# Patient Record
Sex: Female | Born: 1945 | Race: White | Hispanic: No | Marital: Married | Smoking: Never smoker
Health system: Southern US, Community
[De-identification: ages and names within clinical notes are randomized; demographics above are authoritative.]

## PROBLEM LIST (undated history)

## (undated) DIAGNOSIS — M542 Cervicalgia: Secondary | ICD-10-CM

## (undated) DIAGNOSIS — Z9889 Other specified postprocedural states: Secondary | ICD-10-CM

## (undated) DIAGNOSIS — M5030 Other cervical disc degeneration, unspecified cervical region: Secondary | ICD-10-CM

## (undated) DIAGNOSIS — E785 Hyperlipidemia, unspecified: Secondary | ICD-10-CM

## (undated) DIAGNOSIS — B069 Rubella without complication: Secondary | ICD-10-CM

## (undated) DIAGNOSIS — B269 Mumps without complication: Secondary | ICD-10-CM

## (undated) DIAGNOSIS — M48061 Spinal stenosis, lumbar region without neurogenic claudication: Secondary | ICD-10-CM

## (undated) DIAGNOSIS — R002 Palpitations: Secondary | ICD-10-CM

## (undated) DIAGNOSIS — H269 Unspecified cataract: Secondary | ICD-10-CM

## (undated) DIAGNOSIS — R32 Unspecified urinary incontinence: Secondary | ICD-10-CM

## (undated) DIAGNOSIS — H9319 Tinnitus, unspecified ear: Secondary | ICD-10-CM

## (undated) DIAGNOSIS — K648 Other hemorrhoids: Secondary | ICD-10-CM

## (undated) DIAGNOSIS — Z9289 Personal history of other medical treatment: Secondary | ICD-10-CM

## (undated) DIAGNOSIS — K219 Gastro-esophageal reflux disease without esophagitis: Secondary | ICD-10-CM

## (undated) DIAGNOSIS — M1990 Unspecified osteoarthritis, unspecified site: Secondary | ICD-10-CM

## (undated) DIAGNOSIS — J302 Other seasonal allergic rhinitis: Secondary | ICD-10-CM

## (undated) DIAGNOSIS — Z8619 Personal history of other infectious and parasitic diseases: Secondary | ICD-10-CM

## (undated) DIAGNOSIS — R112 Nausea with vomiting, unspecified: Secondary | ICD-10-CM

## (undated) DIAGNOSIS — B059 Measles without complication: Secondary | ICD-10-CM

## (undated) DIAGNOSIS — N3020 Other chronic cystitis without hematuria: Secondary | ICD-10-CM

## (undated) DIAGNOSIS — K5790 Diverticulosis of intestine, part unspecified, without perforation or abscess without bleeding: Secondary | ICD-10-CM

## (undated) HISTORY — PX: COLONOSCOPY: SHX174

## (undated) HISTORY — PX: CYST EXCISION PERINEAL: SHX6278

---

## 1983-01-05 HISTORY — PX: NASAL SINUS SURGERY: SHX719

## 2000-11-02 ENCOUNTER — Ambulatory Visit (HOSPITAL_COMMUNITY): Admission: RE | Admit: 2000-11-02 | Discharge: 2000-11-02 | Payer: Self-pay | Admitting: Gastroenterology

## 2007-05-10 ENCOUNTER — Encounter: Admission: RE | Admit: 2007-05-10 | Discharge: 2007-05-10 | Payer: Self-pay | Admitting: Family Medicine

## 2008-01-18 ENCOUNTER — Other Ambulatory Visit: Admission: RE | Admit: 2008-01-18 | Discharge: 2008-01-18 | Payer: Self-pay | Admitting: Family Medicine

## 2009-02-12 ENCOUNTER — Other Ambulatory Visit: Admission: RE | Admit: 2009-02-12 | Discharge: 2009-02-12 | Payer: Self-pay | Admitting: Family Medicine

## 2010-02-16 ENCOUNTER — Other Ambulatory Visit (HOSPITAL_COMMUNITY)
Admission: RE | Admit: 2010-02-16 | Discharge: 2010-02-16 | Disposition: A | Discharge status: Home / Self Care | Payer: BC Managed Care – PPO | Source: Ambulatory Visit | Attending: Family Medicine | Admitting: Family Medicine

## 2010-02-16 ENCOUNTER — Other Ambulatory Visit: Payer: Self-pay | Admitting: Family Medicine

## 2010-02-16 DIAGNOSIS — Z01419 Encounter for gynecological examination (general) (routine) without abnormal findings: Secondary | ICD-10-CM | POA: Insufficient documentation

## 2010-05-22 NOTE — Procedures (Signed)
[place]  Patient:    HIYA, POINT Visit Number: [id number] MRN: [id number]          Service Type: Attending:  Anselmo Rod, M.D. Dictated by:   Anselmo Rod, M.D. Proc. Date: 11/02/00   CC:         Eula Listen, M.D.                           Procedure Report  DATE OF BIRTH:  May 30, 1945  REFERRING PHYSICIAN:  Eula Listen, M.D.  PROCEDURE PERFORMED:  Colonoscopy.  ENDOSCOPIST:  Anselmo Rod, M.D.  INSTRUMENT USED:  Olympus pediatric video colonoscope.  INDICATIONS FOR PROCEDURE:  Rectal bleeding in a 65 year old white female, rule out colonic polyps, masses, hemorrhoids, etc.  PREPROCEDURE PREPARATION:  Informed consent was procured from the patient. The patient was fasted for eight hours prior to the procedure and prepped with a bottle of magnesium citrate and a gallon of NuLytely the night prior to the procedure.  PREPROCEDURE PHYSICAL:  The patient had stable vital signs.  Neck supple. Chest clear to auscultation.  S1, S2 regular.  Abdomen soft with normal abdominal bowel sounds.  DESCRIPTION OF PROCEDURE:  The patient was placed in the left lateral decubitus position and sedated with 25 mg of Demerol and 2.5 mg of Versed intravenously.  She also received 25 mg of Phenergan prior to the procedure because of nausea.  Once the patient was adequately sedated and maintained on low-flow oxygen and continuous cardiac monitoring, the Olympus video colonoscope was advanced from the rectum to the cecum without difficulty. Small internal hemorrhoids were noted on retroflexion in the rectum.  There was early left-sided diverticulosis.  No other abnormalities were seen.  The patient tolerated the procedure well without complication.  IMPRESSION: 1. Small internal hemorrhoids. 2. Early left-sided diverticulosis. 3. No masses or polyps seen.  RECOMMENDATIONS: 1. A high fiber diet has been advised. 2.  Outpatient follow-up is advised on a p.r.n. basis. 3. Repeat colorectal cancer screening is recommended in the next five years    unless the patient were to develop any abnormal symptoms in the interim.Dictated by:   Anselmo Rod, M.D. Attending:  Anselmo Rod, M.D. DD:  11/02/00 TD:  11/02/00 Job: [id number]

## 2013-01-03 ENCOUNTER — Other Ambulatory Visit (HOSPITAL_COMMUNITY)
Admission: RE | Admit: 2013-01-03 | Discharge: 2013-01-03 | Disposition: A | Discharge status: Home / Self Care | Payer: Medicare Other | Source: Ambulatory Visit | Attending: Obstetrics and Gynecology | Admitting: Obstetrics and Gynecology

## 2013-01-03 ENCOUNTER — Other Ambulatory Visit: Payer: Self-pay | Admitting: Obstetrics and Gynecology

## 2013-01-03 DIAGNOSIS — Z1151 Encounter for screening for human papillomavirus (HPV): Secondary | ICD-10-CM | POA: Insufficient documentation

## 2013-01-03 DIAGNOSIS — Z124 Encounter for screening for malignant neoplasm of cervix: Secondary | ICD-10-CM | POA: Insufficient documentation

## 2013-03-28 ENCOUNTER — Encounter (HOSPITAL_COMMUNITY): Payer: Self-pay | Admitting: Pharmacist

## 2013-03-28 NOTE — Patient Instructions (Addendum)
   Your procedure is scheduled on:  Tuesday, Mar 31  Enter through the [employer] [facility] at: 9 AM Pick up the phone at the desk and dial [phone] and inform us of your arrival.  Please call this number if you have any problems the morning of surgery: [phone]  Remember: Do not eat after midnight: Monday Do Not drink clears after 630 AM Tuesday, day of surgey Take these medicines the morning of surgery with a SIP OF WATER: None  Do not wear jewelry, make-up, or FINGER nail polish No metal in your hair or on your body. Do not wear lotions, powders, perfumes.  You may wear deodorant.  Do not bring valuables to the hospital. Contacts, dentures or bridgework may not be worn into surgery.  Patients discharged on the day of surgery will not be allowed to drive home.  Home with husband [place] cell [phone]

## 2013-03-29 ENCOUNTER — Encounter (HOSPITAL_COMMUNITY)
Admission: RE | Admit: 2013-03-29 | Discharge: 2013-03-29 | Disposition: A | Discharge status: Home / Self Care | Payer: Medicare HMO | Source: Ambulatory Visit | Attending: Obstetrics and Gynecology | Admitting: Obstetrics and Gynecology

## 2013-03-29 ENCOUNTER — Encounter (HOSPITAL_COMMUNITY): Payer: Self-pay

## 2013-03-29 DIAGNOSIS — Z01810 Encounter for preprocedural cardiovascular examination: Secondary | ICD-10-CM | POA: Insufficient documentation

## 2013-03-29 HISTORY — DX: Other specified postprocedural states: R11.2

## 2013-03-29 HISTORY — DX: Nausea with vomiting, unspecified: Z98.890

## 2013-03-29 HISTORY — DX: Hyperlipidemia, unspecified: E78.5

## 2013-03-29 HISTORY — DX: Other seasonal allergic rhinitis: J30.2

## 2013-03-29 HISTORY — DX: Gastro-esophageal reflux disease without esophagitis: K21.9

## 2013-04-03 ENCOUNTER — Ambulatory Visit (HOSPITAL_COMMUNITY): Payer: Medicare HMO | Admitting: Anesthesiology

## 2013-04-03 ENCOUNTER — Encounter (HOSPITAL_COMMUNITY): Payer: Medicare HMO | Admitting: Anesthesiology

## 2013-04-03 ENCOUNTER — Ambulatory Visit (HOSPITAL_COMMUNITY)
Admission: RE | Admit: 2013-04-03 | Discharge: 2013-04-03 | Disposition: A | Discharge status: Home / Self Care | Payer: Medicare HMO | Source: Ambulatory Visit | Attending: Obstetrics and Gynecology | Admitting: Obstetrics and Gynecology

## 2013-04-03 ENCOUNTER — Encounter (HOSPITAL_COMMUNITY): Payer: Self-pay | Admitting: Anesthesiology

## 2013-04-03 ENCOUNTER — Encounter (HOSPITAL_COMMUNITY)
Admission: RE | Disposition: A | Discharge status: Home / Self Care | Payer: Self-pay | Source: Ambulatory Visit | Attending: Obstetrics and Gynecology

## 2013-04-03 DIAGNOSIS — K219 Gastro-esophageal reflux disease without esophagitis: Secondary | ICD-10-CM | POA: Insufficient documentation

## 2013-04-03 DIAGNOSIS — N950 Postmenopausal bleeding: Secondary | ICD-10-CM | POA: Insufficient documentation

## 2013-04-03 DIAGNOSIS — Z9889 Other specified postprocedural states: Secondary | ICD-10-CM

## 2013-04-03 DIAGNOSIS — E785 Hyperlipidemia, unspecified: Secondary | ICD-10-CM | POA: Insufficient documentation

## 2013-04-03 DIAGNOSIS — F3289 Other specified depressive episodes: Secondary | ICD-10-CM | POA: Insufficient documentation

## 2013-04-03 DIAGNOSIS — F329 Major depressive disorder, single episode, unspecified: Secondary | ICD-10-CM | POA: Insufficient documentation

## 2013-04-03 HISTORY — PX: HYSTEROSCOPY WITH D & C: SHX1775

## 2013-04-03 SURGERY — DILATATION AND CURETTAGE /HYSTEROSCOPY
Anesthesia: General | Site: Vagina

## 2013-04-03 MED ORDER — IBUPROFEN 600 MG PO TABS: 600.0000 mg | ORAL_TABLET | Freq: Four times a day (QID) | ORAL | Status: DC | PRN

## 2013-04-03 MED ORDER — FENTANYL CITRATE 0.05 MG/ML IJ SOLN: 25.0000 ug | INTRAMUSCULAR | Status: DC | PRN

## 2013-04-03 MED ORDER — ONDANSETRON HCL 4 MG/2ML IJ SOLN
INTRAMUSCULAR | Status: AC
  Filled 2013-04-03: qty 2

## 2013-04-03 MED ORDER — LIDOCAINE HCL (CARDIAC) 20 MG/ML IV SOLN
INTRAVENOUS | Status: AC
  Filled 2013-04-03: qty 5

## 2013-04-03 MED ORDER — MIDAZOLAM HCL 2 MG/2ML IJ SOLN
INTRAMUSCULAR | Status: DC | PRN
  Administered 2013-04-03: 1 mg via INTRAVENOUS

## 2013-04-03 MED ORDER — ASPIRIN-ACETAMINOPHEN-CAFFEINE 250-250-65 MG PO TABS: 1.0000 | ORAL_TABLET | Freq: Four times a day (QID) | ORAL | Status: DC | PRN

## 2013-04-03 MED ORDER — LACTATED RINGERS IV SOLN: INTRAVENOUS | Status: DC | PRN

## 2013-04-03 MED ORDER — ONDANSETRON HCL 4 MG/2ML IJ SOLN
INTRAMUSCULAR | Status: DC | PRN
  Administered 2013-04-03: 4 mg via INTRAVENOUS

## 2013-04-03 MED ORDER — FENTANYL CITRATE 0.05 MG/ML IJ SOLN
INTRAMUSCULAR | Status: AC
  Filled 2013-04-03: qty 2

## 2013-04-03 MED ORDER — LIDOCAINE HCL 2 % IJ SOLN
INTRAMUSCULAR | Status: AC
  Filled 2013-04-03: qty 20

## 2013-04-03 MED ORDER — DIPHENHYDRAMINE HCL 50 MG/ML IJ SOLN
INTRAMUSCULAR | Status: AC
  Filled 2013-04-03: qty 1

## 2013-04-03 MED ORDER — KETOROLAC TROMETHAMINE 30 MG/ML IJ SOLN
INTRAMUSCULAR | Status: DC | PRN
  Administered 2013-04-03: 30 mg via INTRAVENOUS

## 2013-04-03 MED ORDER — FENTANYL CITRATE 0.05 MG/ML IJ SOLN
INTRAMUSCULAR | Status: DC | PRN
  Administered 2013-04-03 (×2): 50 ug via INTRAVENOUS

## 2013-04-03 MED ORDER — CEFAZOLIN SODIUM-DEXTROSE 2-3 GM-% IV SOLR
INTRAVENOUS | Status: AC
  Filled 2013-04-03: qty 50

## 2013-04-03 MED ORDER — LIDOCAINE HCL 2 % IJ SOLN
INTRAMUSCULAR | Status: DC | PRN
  Administered 2013-04-03: 10 mL

## 2013-04-03 MED ORDER — MEPERIDINE HCL 25 MG/ML IJ SOLN
6.2500 mg | INTRAMUSCULAR | Status: DC | PRN
Start: 2013-04-03 — End: 2013-04-03

## 2013-04-03 MED ORDER — DIPHENHYDRAMINE HCL 50 MG/ML IJ SOLN
INTRAMUSCULAR | Status: DC | PRN
  Administered 2013-04-03: 12.5 mg via INTRAVENOUS

## 2013-04-03 MED ORDER — DEXAMETHASONE SODIUM PHOSPHATE 10 MG/ML IJ SOLN
INTRAMUSCULAR | Status: AC
  Filled 2013-04-03: qty 1

## 2013-04-03 MED ORDER — LIDOCAINE HCL (CARDIAC) 20 MG/ML IV SOLN
INTRAVENOUS | Status: DC | PRN
  Administered 2013-04-03: 20 mg via INTRAVENOUS
  Administered 2013-04-03: 70 mg via INTRAVENOUS

## 2013-04-03 MED ORDER — CEFAZOLIN SODIUM-DEXTROSE 2-3 GM-% IV SOLR
2.0000 g | INTRAVENOUS | Status: AC
  Administered 2013-04-03: 2 g via INTRAVENOUS

## 2013-04-03 MED ORDER — LACTATED RINGERS IV SOLN
INTRAVENOUS | Status: DC | PRN
  Administered 2013-04-03 (×3): via INTRAVENOUS

## 2013-04-03 MED ORDER — PROPOFOL 10 MG/ML IV EMUL
INTRAVENOUS | Status: AC
  Filled 2013-04-03: qty 20

## 2013-04-03 MED ORDER — DEXAMETHASONE SODIUM PHOSPHATE 10 MG/ML IJ SOLN
INTRAMUSCULAR | Status: DC | PRN
  Administered 2013-04-03: 10 mg via INTRAVENOUS

## 2013-04-03 MED ORDER — PROPOFOL 10 MG/ML IV BOLUS
INTRAVENOUS | Status: DC | PRN
  Administered 2013-04-03: 180 mg via INTRAVENOUS

## 2013-04-03 MED ORDER — METOCLOPRAMIDE HCL 5 MG/ML IJ SOLN: 10.0000 mg | Freq: Once | INTRAMUSCULAR | Status: DC | PRN

## 2013-04-03 MED ORDER — KETOROLAC TROMETHAMINE 30 MG/ML IJ SOLN
INTRAMUSCULAR | Status: AC
  Filled 2013-04-03: qty 1

## 2013-04-03 MED ORDER — LACTATED RINGERS IV SOLN
INTRAVENOUS | Status: DC
  Administered 2013-04-03: 11:00:00 via INTRAVENOUS

## 2013-04-03 MED ORDER — MIDAZOLAM HCL 2 MG/2ML IJ SOLN
INTRAMUSCULAR | Status: AC
  Filled 2013-04-03: qty 2

## 2013-04-03 SURGICAL SUPPLY — 21 items
CANISTER SUCT 3000ML (MISCELLANEOUS) ×2 IMPLANT
CATH ROBINSON RED A/P 16FR (CATHETERS) ×2 IMPLANT
CLOTH BEACON ORANGE TIMEOUT ST (SAFETY) ×2 IMPLANT
CONTAINER PREFILL 10% NBF 60ML (FORM) ×4 IMPLANT
DILATOR CANAL MILEX (MISCELLANEOUS) IMPLANT
DRAPE HYSTEROSCOPY (DRAPE) ×2 IMPLANT
DRSG TELFA 3X8 NADH (GAUZE/BANDAGES/DRESSINGS) ×2 IMPLANT
GLOVE BIO SURGEON STRL SZ7 (GLOVE) ×2 IMPLANT
GLOVE BIOGEL PI IND STRL 7.0 (GLOVE) ×1 IMPLANT
GLOVE BIOGEL PI INDICATOR 7.0 (GLOVE) ×1
GOWN STRL REUS W/TWL LRG LVL3 (GOWN DISPOSABLE) ×4 IMPLANT
NDL SPNL 22GX3.5 QUINCKE BK (NEEDLE) ×1 IMPLANT
NEEDLE SPNL 22GX3.5 QUINCKE BK (NEEDLE) ×2 IMPLANT
PACK VAGINAL MINOR WOMEN LF (CUSTOM PROCEDURE TRAY) ×2 IMPLANT
PAD DRESSING TELFA 3X8 NADH (GAUZE/BANDAGES/DRESSINGS) ×1 IMPLANT
PAD OB MATERNITY 4.3X12.25 (PERSONAL CARE ITEMS) ×2 IMPLANT
SET TUBING HYSTEROSCOPY 2 NDL (TUBING) ×1 IMPLANT
SYR CONTROL 10ML LL (SYRINGE) ×2 IMPLANT
TOWEL OR 17X24 6PK STRL BLUE (TOWEL DISPOSABLE) ×4 IMPLANT
TUBE HYSTEROSCOPY W Y-CONNECT (TUBING) ×1 IMPLANT
WATER STERILE IRR 1000ML POUR (IV SOLUTION) ×2 IMPLANT

## 2013-04-03 NOTE — H&P (Signed)
History of Present Illness  General:  68 y/o presents for preop for hysteroscopy D&C Pt initially presented with a h/o PMB x 3 years. EMB done 12/2012 showed a benign polyp. An ultrasound after EMB showed EMS of less than 2 mm. Observation was recommended at that time. Pt had another episode of heavy bleeding for a few days. Pt given Estrace in prepartion for procedure. Completed a few weeks. Stopped using it 10 days ago.  Pt denies any pelvic pain.   Current Medications  Taking   Multivitamins Capsule as directed   Calcium + D 600-200 MG-UNIT Tablet 1 tablet Once a day   Vitamin D3 2000 UNIT Capsule as directed   Potassium 99 MG Tablet as directed   Ocuvite Adult 50+ Capsule as directed   Flonase 50 MCG/ACT Suspension 2 sprays at bedtime   Benadryl 25 MG Capsule 1 capsule Twice a day   Vitamin B12 100 MCG Tablet 1 tablet Once a day   Cranberry 405 MG Capsule   Eye Drops 0.05 % Solution 1 drop into affected eye as needed every 6 hrs   Zyrtec Allergy 10 MG Tablet 1 tablet as needed Once a day   Magnesium 300 MG Capsule 1 capsule with a meal Once a day   Zinc 100 MG Tablet 1 tablet with a meal Once a day   Pravastatin Sodium 40 MG Tablet 1 tablet Once a day   Niacin 500 MG Tablet as directed Twice a day   Flaxseed Oil 1300 MG Capsule as directed   Duloxetine HCl 60 MG Capsule Delayed Release Particles 1 capsule Every evening on a full stomach with high protein content   Aleve 220 MG Capsule , Notes: 2 tablets qam , 1 at night   Not-Taking/PRN   Ibuprofen 200 MG Tablet 1 tablet as needed every 4 hrs, Notes: Last use less than 1 week ago   Tylenol 325 MG Tablet 2 tablets every 6 hrs, as needed for pain   Mucinex 600 MG Tablet Extended Release 12 Hour 1 tablet as needed every 12 hrs   Zaditor as needed/eye drops   Meclizine HCl 12.5 MG Tablet 2 tablets q 8 hrs, prn dizzines   Discontinued   Estrace 0.1 MG/GM Cream 1 gram 1 gram twice weekly   Medication List reviewed and reconciled  with the patient    Past Medical History  Allergic rhinitis  History of hypokalemia  History of internal hemorrhoid  history of elevated LDL cholesterol  Dental implant x 1   Surgical History  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    Social History  General:  Tobacco use  cigarettes: Never smoked Tobacco history last updated 03/27/2013 no Smoking.  no Alcohol.  Caffeine: yes, coffee, soda, tea.  no Recreational drug use.  Exercise: nothing structured.  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  Marital Status: married.  Children: Grinnell, Crystal.  Religion: [employer].  Seat belt use: always.    Gyn History  Sexual activity currently sexually active.  Periods : postmenopausal at age55.  LMP spotting week of Feb 10th and then turned into a regular like period that lasted for 2-3 days.  Birth control vasectomy.  Last pap smear date 01/03/13, all negative.  Last mammogram date 02/28/12.  Denies H/O Abnormal pap smear.  STD none.  Menarche 71.  GYN procedures Endometrial Biopsy, 01/03/13, benign polyp, Pelvic U/S, 01/10/13.    OB History  Number of pregnancies 2.  Pregnancy # 1 live birth, vaginal delivery, girl.  Pregnancy # 2 Full term, live birth, normal vaginal delivery.    Allergies  achromycin: disoreintation  Aspirin: tenitis   Hospitalization/Major Diagnostic Procedure  child birth x2    Review of Systems  Denies fever/chills, chest pain, SOB,  headaches, numbness/tingling. No h/o complication with anesthesia, bleeding disorders or blood clots.   Vital Signs  Wt 162, Wt change 3 lb, Ht 62, BMI 29.63, Pulse sitting 82, BP sitting 138/92.   Physical Examination  GENERAL:  Patient appears alert and oriented.  General Appearance: well-appearing, well-developed, no acute distress.  Speech: clear.  LUNGS:  Auscultation: no wheezing/rhonchi/rales. CTA bilaterally.  HEART:  Heart sounds: normal. RRR. no murmur.  ABDOMEN:  General: soft nontender, nondistended, no masses.  FEMALE GENITOURINARY:  Pelvic Not examined.  EXTREMITIES:  General: No edema or calf tenderness.     Assessments   1. Pre-op exam - V72.84 (Primary)   2. Post-menopausal bleeding - 627.1   Treatment  1. Pre-op exam  Notes: R/B/A of procedure discussed with pt and husband at length. All questions answered. Consent obtained.    Follow Up  2 Weeks post op

## 2013-04-03 NOTE — Discharge Instructions (Addendum)
Hysteroscopy Hysteroscopy is a procedure used for looking inside the womb (uterus). It may be done for various reasons, including:  To evaluate abnormal bleeding, fibroid (benign, noncancerous) tumors, polyps, scar tissue (adhesions), and possibly cancer of the uterus.  To look for lumps (tumors) and other uterine growths.  To look for causes of why a woman cannot get pregnant (infertility), causes of recurrent loss of pregnancy (miscarriages), or a lost intrauterine device (IUD).  To perform a sterilization by blocking the fallopian tubes from inside the uterus. In this procedure, a thin, flexible tube with a tiny light and camera on the end of it (hysteroscope) is used to look inside the uterus. A hysteroscopy should be done right after a menstrual period to be sure you are not pregnant. LET [facility] CARE PROVIDER KNOW ABOUT:   Any allergies you have.  All medicines you are taking, including vitamins, herbs, eye drops, creams, and over-the-counter medicines.  Previous problems you or members of your family have had with the use of anesthetics.  Any blood disorders you have.  Previous surgeries you have had.  Medical conditions you have. RISKS AND COMPLICATIONS  Generally, this is a safe procedure. However, as with any procedure, complications can occur. Possible complications include:  Putting a hole in the uterus.  Excessive bleeding.  Infection.  Damage to the cervix.  Injury to other organs.  Allergic reaction to medicines.  Too much fluid used in the uterus for the procedure. BEFORE THE PROCEDURE   Ask your health care provider about changing or stopping any regular medicines.  Do not take aspirin or blood thinners for 1 week before the procedure, or as directed by your health care provider. These can cause bleeding.  If you smoke, do not smoke for 2 weeks before the procedure.  In some cases, a medicine is placed in the cervix the day before the procedure.  This medicine makes the cervix have a larger opening (dilate). This makes it easier for the instrument to be inserted into the uterus during the procedure.  Do not eat or drink anything for at least 8 hours before the surgery.  Arrange for someone to take you home after the procedure. PROCEDURE   You may be given a medicine to relax you (sedative). You may also be given one of the following:  A medicine that numbs the area around the cervix (local anesthetic).  A medicine that makes you sleep through the procedure (general anesthetic).  The hysteroscope is inserted through the vagina into the uterus. The camera on the hysteroscope sends a picture to a TV screen. This gives the surgeon a good view inside the uterus.  During the procedure, air or a liquid is put into the uterus, which allows the surgeon to see better.  Sometimes, tissue is gently scraped from inside the uterus. These tissue samples are sent to a lab for testing. AFTER THE PROCEDURE   If you had a general anesthetic, you may be groggy for a couple hours after the procedure.  If you had a local anesthetic, you will be able to go home as soon as you are stable and feel ready.  You may have some cramping. This normally lasts for a couple days.  You may have bleeding, which varies from light spotting for a few days to menstrual-like bleeding for 3 7 days. This is normal.  If your test results are not back during the visit, make an appointment with your health care provider to find out  the results. Document Released: 03/29/2000 Document Revised: 10/11/2012 Document Reviewed: 07/20/2012 [facility] Patient Information 2014 [place], [place].    DO NOT TAKE IBUPROFEN NOR ALEVE UNTIL 5 PM TODAY.  YOU RECEIVED A SIMILAR MEDICATION IN THE OPERATING ROOM TODAY.

## 2013-04-03 NOTE — Op Note (Signed)
NAMEIVIONA, HOLE NO.:  [id number]  MEDICAL RECORD NO.:  [id number]  LOCATION:  [facility]                          FACILITY:  [facility]  PHYSICIAN:  Jola Schmidt, MD   DATE OF BIRTH:  [birth year]  DATE OF PROCEDURE:  04/03/2013 DATE OF DISCHARGE:  04/03/2013                              OPERATIVE REPORT   PREOPERATIVE DIAGNOSIS:  Postmenopausal bleeding and endometrial polyp.  POSTOPERATIVE DIAGNOSIS:  Postmenopausal bleeding and endometrial polyp.  PROCEDURE:  Hysteroscopy, D and C.  SURGEON:  Jola Schmidt, MD.  ASSISTANT:  None.  ANESTHESIA:  General (LMA) and Local with 2% lidocaine (10 mL).  IV FLUIDS IN:  A 1000 mL.  URINE OUT:  At the beginning of the case 250 mL.  ESTIMATED BLOOD LOSS:  Minimal.  SPECIMENS:  Endometrial curettings to Pathology.  DISPOSITION:  The patient to PACU hemodynamically stable.  COMPLICATIONS:  None.  FINDINGS:  Atrophic endometrium.  No endometrial polyp was seen, a small raised area appears benign.  No obvious signs of any adhesions or any signs suspicious for endometrial cancer.  Normal endometrial cavity. Otherwise, ostia visualized.  No evidence of perforation.  PROCEDURE IN DETAIL:  Ms. Losier was identified in the holding area. She was then taken into the operating room, placed in the dorsal supine position, and underwent LMA anesthesia without complication.  She was then placed in the dorsal lithotomy position and prepped and draped in a normal sterile fashion.  I and O cath was performed.  Graves speculum was inserted into the vaginal vault.  Paracervical block was performed with 2% lidocaine.  The anterior lip of the cervix was grasped with a single-tooth tenaculum.  The OS Finder was used to identify the endocervical canal.  The hysteroscope was then advanced slowly due to the uterine fundus and the findings above were noted.  The hysteroscope was then removed.  Telfa was placed in the vagina.  Sharp  curettage of all 4 quadrants was performed.  There was more tissue on the anterior quadrant.  I then took a second look with the hysteroscope to confirm that there was no evidence of perforation and everything appeared to be normal.  Hysteroscope, single-tooth tenaculum, and  Graves speculum were all removed from the vagina.  There was no bleeding noted at the time of removal of the tenaculum.  The patient tolerated the procedure well.  She was awakened in the OR, and taken to the recovery room in stable condition.  She was on SCDs throughout the entire case, and she received Ancef 2 g IV prior to the procedure.     Jola Schmidt, MD     EBV/MEDQ  D:  04/03/2013  T:  04/03/2013  Job:  [id number]

## 2013-04-03 NOTE — Anesthesia Preprocedure Evaluation (Addendum)
Anesthesia Evaluation  Patient identified by MRN, date of birth, ID band Patient awake    Reviewed: Allergy & Precautions, H&P , NPO status , Patient's Chart, lab work & pertinent test results  History of Anesthesia Complications (+) PONV and history of anesthetic complications  Airway Mallampati: III TM Distance: >3 FB Neck ROM: Full    Dental no notable dental hx. (+) Teeth Intact   Pulmonary neg pulmonary ROS,  breath sounds clear to auscultation  Pulmonary exam normal       Cardiovascular negative cardio ROS  Rhythm:Regular Rate:Normal     Neuro/Psych PSYCHIATRIC DISORDERS Depression negative neurological ROS     GI/Hepatic Neg liver ROS, GERD-  Medicated and Controlled,  Endo/Other  Hyperlipidemia  Renal/GU negative Renal ROS  negative genitourinary   Musculoskeletal negative musculoskeletal ROS (+)   Abdominal   Peds  Hematology negative hematology ROS (+)   Anesthesia Other Findings   Reproductive/Obstetrics Post menopausal bleeding                           Anesthesia Physical Anesthesia Plan  ASA: II  Anesthesia Plan: General   Post-op Pain Management:    Induction: Intravenous  Airway Management Planned: LMA  Additional Equipment:   Intra-op Plan:   Post-operative Plan: Extubation in OR  Informed Consent: I have reviewed the patients History and Physical, chart, labs and discussed the procedure including the risks, benefits and alternatives for the proposed anesthesia with the patient or authorized representative who has indicated his/her understanding and acceptance.   Dental advisory given  Plan Discussed with: Anesthesiologist, CRNA and Surgeon  Anesthesia Plan Comments:         Anesthesia Quick Evaluation

## 2013-04-03 NOTE — Transfer of Care (Signed)
Immediate Anesthesia Transfer of Care Note  Patient: Natasha [name]  Procedure(s) Performed: Procedure(s): DILATATION AND CURETTAGE /HYSTEROSCOPY (N/A)  Patient Location: PACU  Anesthesia Type:General  Level of Consciousness: awake, oriented, sedated and patient cooperative  Airway & Oxygen Therapy: Patient Spontanous Breathing and Patient connected to nasal cannula oxygen  Post-op Assessment: Report given to PACU RN and Post -op Vital signs reviewed and stable  Post vital signs: Reviewed and stable  Complications: No apparent anesthesia complications

## 2013-04-03 NOTE — Anesthesia Postprocedure Evaluation (Signed)
  Anesthesia Post-op Note  Patient: Natasha [name]  Procedure(s) Performed: Procedure(s): DILATATION AND CURETTAGE /HYSTEROSCOPY (N/A)  Patient Location: PACU  Anesthesia Type:General  Level of Consciousness: awake, alert  and oriented  Airway and Oxygen Therapy: Patient Spontanous Breathing  Post-op Pain: mild  Post-op Assessment: Post-op Vital signs reviewed, Patient's Cardiovascular Status Stable, Respiratory Function Stable, Patent Airway, No signs of Nausea or vomiting and Pain level controlled  Post-op Vital Signs: Reviewed and stable  Complications: No apparent anesthesia complications\

## 2013-04-03 NOTE — Brief Op Note (Signed)
04/03/2013  11:16 AM  PATIENT:  Natasha [name]  68 y.o. female  PRE-OPERATIVE DIAGNOSIS:  PMB, endometrial polyp  POST-OPERATIVE DIAGNOSIS:  post menopausal bleeding  PROCEDURE:  Procedure(s): DILATATION AND CURETTAGE /HYSTEROSCOPY (N/A)  SURGEON:  Surgeon(s) and Role:    * Thurnell Lose, MD - Primary  PHYSICIAN ASSISTANT: None  ASSISTANTS: none   ANESTHESIA:   general (LMA)  EBL:  Total I/O In: 1000 [I.V.:1000] Out: 250 [Urine:250]  BLOOD ADMINISTERED:none  DRAINS: none   LOCAL MEDICATIONS USED:  2 % LIDOCAINE (10 cc)  SPECIMEN:  Source of Specimen:  endometrial currettings  DISPOSITION OF SPECIMEN:  PATHOLOGY  COUNTS:  YES  TOURNIQUET:  * No tourniquets in log *  DICTATION: .Other Dictation: Dictation Number [id number]  PLAN OF CARE: Discharge to home after PACU  PATIENT DISPOSITION:  PACU - hemodynamically stable.   Delay start of Pharmacological VTE agent (>24hrs) due to surgical blood loss or risk of bleeding: not applicable

## 2013-04-03 NOTE — Interval H&P Note (Signed)
History and Physical Interval Note:  04/03/2013 10:27 AM  Natasha [name]  has presented today for surgery, with the diagnosis of PMB  The various methods of treatment have been discussed with the patient and family. After consideration of risks, benefits and other options for treatment, the patient has consented to  Procedure(s): DILATATION AND CURETTAGE /HYSTEROSCOPY (N/A) as a surgical intervention .  The patient's history has been reviewed, patient examined, no change in status, stable for surgery.  I have reviewed the patient's chart and labs.  Questions were answered to the patient's satisfaction.     Simona Huh, Eshan Trupiano

## 2013-04-04 ENCOUNTER — Encounter (HOSPITAL_COMMUNITY): Payer: Self-pay | Admitting: Obstetrics and Gynecology

## 2013-04-04 HISTORY — PX: ROTATOR CUFF REPAIR: SHX139

## 2014-03-19 ENCOUNTER — Ambulatory Visit: Payer: Self-pay | Admitting: Orthopedic Surgery

## 2014-03-19 NOTE — Progress Notes (Signed)
Preoperative surgical orders have been place into the Epic hospital system for Natasha [name] on 03/19/2014, 2:11 PM  by Mickel Crow for surgery on 04-03-2014.  Preop Total Hip - Anterior Approach orders including Experel Injecion, IV Tylenol, and IV Decadron as long as there are no contraindications to the above medications. Arlee Muslim, PA-C

## 2014-03-22 NOTE — Patient Instructions (Addendum)
Natasha [name]  03/22/2014   Your procedure is scheduled on: 04/03/14   Report to [facility] Main  Entrance and follow signs to               [place] at 12:40 PM.   Call this number if you have problems the morning of surgery [phone]   Remember:  Do not eat food  :After Midnight.              MAY HAVE CLEAR LIQUIDS UNTIL 8:40 AM                                  CLEAR LIQUID DIET   Foods Allowed                                                                     Foods Excluded  Coffee and tea, regular and decaf                             liquids that you cannot  Plain Jell-O in any flavor                                             see through such as: Fruit ices (not with fruit pulp)                                     milk, soups, orange juice  Iced Popsicles                                                All solid food Carbonated beverages, regular and diet                                    Cranberry, grape and apple juices Sports drinks like Gatorade Lightly seasoned clear broth or consume(fat free) Sugar, honey syrup  _____________________________________________________________________   Take these medicines the morning of surgery with A SIP OF WATER: zyrtec, pepcid if needed, eyedrops if needed                               You may not have any metal on your body including hair pins and              piercings  Do not wear jewelry, make-up, lotions, powders or perfumes.             Do not wear nail polish.  Do not shave  48 hours prior to surgery.              Men may shave face and  neck.  Do not bring valuables to the hospital. [place].  Contacts, dentures or bridgework may not be worn into surgery.  Leave suitcase in the car. After surgery it may be brought to your room.               Please read over the following fact sheets you were given: MRSA  information _____________________________________________________________________                                                     [place]  Before surgery, you can play an important role.  Because skin is not sterile, your skin needs to be as free of germs as possible.  You can reduce the number of germs on your skin by washing with CHG (chlorahexidine gluconate) soap before surgery.  CHG is an antiseptic cleaner which kills germs and bonds with the skin to continue killing germs even after washing. Please DO NOT use if you have an allergy to CHG or antibacterial soaps.  If your skin becomes reddened/irritated stop using the CHG and inform your nurse when you arrive at Short Stay. Do not shave (including legs and underarms) for at least 48 hours prior to the first CHG shower.  You may shave your face. Please follow these instructions carefully:   1.  Shower with CHG Soap the night before surgery and the  morning of Surgery.   2.  If you choose to wash your hair, wash your hair first as usual with your  normal  Shampoo.   3.  After you shampoo, rinse your hair and body thoroughly to remove the  shampoo.                                         4.  Use CHG as you would any other liquid soap.  You can apply chg directly  to the skin and wash . Gently wash with scrungie or clean wascloth    5.  Apply the CHG Soap to your body ONLY FROM THE NECK DOWN.   Do not use on open                           Wound or open sores. Avoid contact with eyes, ears mouth and genitals (private parts).                        Genitals (private parts) with your normal soap.              6.  Wash thoroughly, paying special attention to the area where your surgery  will be performed.   7.  Thoroughly rinse your body with warm water from the neck down.   8.  DO NOT shower/wash with your normal soap after using and rinsing off  the CHG Soap .                9.  Pat yourself dry with a  clean towel.             10.  Wear clean pajamas.             11.  Place clean sheets on your bed the night of your first shower and do not  sleep with pets.  Day of Surgery : Do not apply any lotions/deodorants the morning of surgery.  Please wear clean clothes to the hospital/surgery center.  FAILURE TO FOLLOW THESE INSTRUCTIONS MAY RESULT IN THE CANCELLATION OF YOUR SURGERY    PATIENT SIGNATURE_________________________________  ______________________________________________________________________     Natasha [name]  An incentive spirometer is a tool that can help keep your lungs clear and active. This tool measures how well you are filling your lungs with each breath. Taking long deep breaths may help reverse or decrease the chance of developing breathing (pulmonary) problems (especially infection) following:  A long period of time when you are unable to move or be active. BEFORE THE PROCEDURE   If the spirometer includes an indicator to show your best effort, your nurse or respiratory therapist will set it to a desired goal.  If possible, sit up straight or lean slightly forward. Try not to slouch.  Hold the incentive spirometer in an upright position. INSTRUCTIONS FOR USE   Sit on the edge of your bed if possible, or sit up as far as you can in bed or on a chair.  Hold the incentive spirometer in an upright position.  Breathe out normally.  Place the mouthpiece in your mouth and seal your lips tightly around it.  Breathe in slowly and as deeply as possible, raising the piston or the ball toward the top of the column.  Hold your breath for 3-5 seconds or for as long as possible. Allow the piston or ball to fall to the bottom of the column.  Remove the mouthpiece from your mouth and breathe out normally.  Rest for a few seconds and repeat Steps 1 through 7 at least 10 times every 1-2 hours when you are awake. Take your time and take a few normal breaths  between deep breaths.  The spirometer may include an indicator to show your best effort. Use the indicator as a goal to work toward during each repetition.  After each set of 10 deep breaths, practice coughing to be sure your lungs are clear. If you have an incision (the cut made at the time of surgery), support your incision when coughing by placing a pillow or rolled up towels firmly against it. Once you are able to get out of bed, walk around indoors and cough well. You may stop using the incentive spirometer when instructed by your caregiver.  RISKS AND COMPLICATIONS  Take your time so you do not get dizzy or light-headed.  If you are in pain, you may need to take or ask for pain medication before doing incentive spirometry. It is harder to take a deep breath if you are having pain. AFTER USE  Rest and breathe slowly and easily.  It can be helpful to keep track of a log of your progress. Your caregiver can provide you with a simple table to help with this. If you are using the spirometer at home, follow these instructions: [place] IF:   You are having difficultly using the spirometer.  You have trouble using the spirometer as often as instructed.  Your pain medication is not giving enough relief while using the spirometer.  You develop fever of 100.5 F (38.1 C) or higher. SEEK IMMEDIATE MEDICAL CARE IF:   You cough up  bloody sputum that had not been present before.  You develop fever of 102 F (38.9 C) or greater.  You develop worsening pain at or near the incision site. MAKE SURE YOU:   Understand these instructions.  Will watch your condition.  Will get help right away if you are not doing well or get worse. Document Released: 05/03/2006 Document Revised: 03/15/2011 Document Reviewed: 07/04/2006 ExitCare Patient Information 2014 ExitCare, [place].   ________________________________________________________________________  WHAT IS A BLOOD TRANSFUSION?  Blood Transfusion Information  A transfusion is the replacement of blood or some of its parts. Blood is made up of multiple cells which provide different functions.  Red blood cells carry oxygen and are used for blood loss replacement.  White blood cells fight against infection.  Platelets control bleeding.  Plasma helps clot blood.  Other blood products are available for specialized needs, such as hemophilia or other clotting disorders. BEFORE THE TRANSFUSION  Who gives blood for transfusions?   Healthy volunteers who are fully evaluated to make sure their blood is safe. This is blood bank blood. Transfusion therapy is the safest it has ever been in the practice of medicine. Before blood is taken from a donor, a complete history is taken to make sure that person has no history of diseases nor engages in risky social behavior (examples are intravenous drug use or sexual activity with multiple partners). The donor's travel history is screened to minimize risk of transmitting infections, such as malaria. The donated blood is tested for signs of infectious diseases, such as HIV and hepatitis. The blood is then tested to be sure it is compatible with you in order to minimize the chance of a transfusion reaction. If you or a relative donates blood, this is often done in anticipation of surgery and is not appropriate for emergency situations. It takes many days to process the donated blood. RISKS AND COMPLICATIONS Although transfusion therapy is very safe and saves many lives, the main dangers of transfusion include:   Getting an infectious disease.  Developing a transfusion reaction. This is an allergic reaction to something in the blood you were given. Every precaution is taken to prevent this. The decision to have a blood transfusion has been considered carefully by your caregiver before blood is given. Blood is not given unless the benefits outweigh the risks. AFTER THE TRANSFUSION  Right  after receiving a blood transfusion, you will usually feel much better and more energetic. This is especially true if your red blood cells have gotten low (anemic). The transfusion raises the level of the red blood cells which carry oxygen, and this usually causes an energy increase.  The nurse administering the transfusion will monitor you carefully for complications. HOME CARE INSTRUCTIONS  No special instructions are needed after a transfusion. You may find your energy is better. Speak with your caregiver about any limitations on activity for underlying diseases you may have. SEEK MEDICAL CARE IF:   Your condition is not improving after your transfusion.  You develop redness or irritation at the intravenous (IV) site. SEEK IMMEDIATE MEDICAL CARE IF:  Any of the following symptoms occur over the next 12 hours:  Shaking chills.  You have a temperature by mouth above 102 F (38.9 C), not controlled by medicine.  Chest, back, or muscle pain.  People around you feel you are not acting correctly or are confused.  Shortness of breath or difficulty breathing.  Dizziness and fainting.  You get a rash or develop hives.  You have  a decrease in urine output.  Your urine turns a dark color or changes to pink, red, or brown. Any of the following symptoms occur over the next 10 days:  You have a temperature by mouth above 102 F (38.9 C), not controlled by medicine.  Shortness of breath.  Weakness after normal activity.  The white part of the eye turns yellow (jaundice).  You have a decrease in the amount of urine or are urinating less often.  Your urine turns a dark color or changes to pink, red, or brown. Document Released: 12/19/1999 Document Revised: 03/15/2011 Document Reviewed: 08/07/2007 [employer] Patient Information 2014 [place], [place].  _______________________________________________________________________

## 2014-03-25 ENCOUNTER — Encounter (HOSPITAL_COMMUNITY): Payer: Self-pay

## 2014-03-25 ENCOUNTER — Encounter (HOSPITAL_COMMUNITY)
Admission: RE | Admit: 2014-03-25 | Discharge: 2014-03-25 | Disposition: A | Discharge status: Home / Self Care | Payer: Worker's Compensation | Source: Ambulatory Visit | Attending: Orthopedic Surgery | Admitting: Orthopedic Surgery

## 2014-03-25 DIAGNOSIS — Z01812 Encounter for preprocedural laboratory examination: Secondary | ICD-10-CM | POA: Insufficient documentation

## 2014-03-25 HISTORY — DX: Unspecified urinary incontinence: R32

## 2014-03-25 HISTORY — DX: Other hemorrhoids: K64.8

## 2014-03-25 HISTORY — DX: Mumps without complication: B26.9

## 2014-03-25 HISTORY — DX: Personal history of other medical treatment: Z92.89

## 2014-03-25 HISTORY — DX: Tinnitus, unspecified ear: H93.19

## 2014-03-25 HISTORY — DX: Spinal stenosis, lumbar region without neurogenic claudication: M48.061

## 2014-03-25 HISTORY — DX: Unspecified osteoarthritis, unspecified site: M19.90

## 2014-03-25 HISTORY — DX: Personal history of other infectious and parasitic diseases: Z86.19

## 2014-03-25 HISTORY — DX: Other cervical disc degeneration, unspecified cervical region: M50.30

## 2014-03-25 HISTORY — DX: Cervicalgia: M54.2

## 2014-03-25 HISTORY — DX: Unspecified cataract: H26.9

## 2014-03-25 HISTORY — DX: Measles without complication: B05.9

## 2014-03-25 HISTORY — DX: Other chronic cystitis without hematuria: N30.20

## 2014-03-25 HISTORY — DX: Diverticulosis of intestine, part unspecified, without perforation or abscess without bleeding: K57.90

## 2014-03-25 HISTORY — DX: Rubella without complication: B06.9

## 2014-03-25 LAB — COMPREHENSIVE METABOLIC PANEL
ALBUMIN: 4.1 g/dL (ref 3.5–5.2)
ALT: 20 U/L (ref 0–35)
AST: 24 U/L (ref 0–37)
Alkaline Phosphatase: 106 U/L (ref 39–117)
Anion gap: 9 (ref 5–15)
BILIRUBIN TOTAL: 0.5 mg/dL (ref 0.3–1.2)
BUN: 15 mg/dL (ref 6–23)
CO2: 29 mmol/L (ref 19–32)
Calcium: 9.9 mg/dL (ref 8.4–10.5)
Chloride: 106 mmol/L (ref 96–112)
Creatinine, Ser: 0.95 mg/dL (ref 0.50–1.10)
GFR calc Af Amer: 70 mL/min — ABNORMAL LOW (ref >90)
GFR, EST NON AFRICAN AMERICAN: 60 mL/min — AB (ref >90)
GLUCOSE: 100 mg/dL — AB (ref 70–99)
POTASSIUM: 4.7 mmol/L (ref 3.5–5.1)
SODIUM: 144 mmol/L (ref 135–145)
Total Protein: 7 g/dL (ref 6.0–8.3)

## 2014-03-25 LAB — URINALYSIS, ROUTINE W REFLEX MICROSCOPIC
BILIRUBIN URINE: NEGATIVE
GLUCOSE, UA: NEGATIVE mg/dL
Hgb urine dipstick: NEGATIVE
KETONES UR: NEGATIVE mg/dL
LEUKOCYTES UA: NEGATIVE
Nitrite: NEGATIVE
PROTEIN: NEGATIVE mg/dL
Specific Gravity, Urine: 1.005 (ref 1.005–1.030)
Urobilinogen, UA: 0.2 mg/dL (ref 0.0–1.0)
pH: 7.5 (ref 5.0–8.0)

## 2014-03-25 LAB — PROTIME-INR
INR: 0.93 (ref 0.00–1.49)
Prothrombin Time: 12.6 seconds (ref 11.6–15.2)

## 2014-03-25 LAB — CBC
HCT: 39.6 % (ref 36.0–46.0)
Hemoglobin: 13.2 g/dL (ref 12.0–15.0)
MCH: 31.7 pg (ref 26.0–34.0)
MCHC: 33.3 g/dL (ref 30.0–36.0)
MCV: 95.2 fL (ref 78.0–100.0)
Platelets: 296 10*3/uL (ref 150–400)
RBC: 4.16 MIL/uL (ref 3.87–5.11)
RDW: 14.6 % (ref 11.5–15.5)
WBC: 7 10*3/uL (ref 4.0–10.5)

## 2014-03-25 LAB — SURGICAL PCR SCREEN
MRSA, PCR: NEGATIVE
STAPHYLOCOCCUS AUREUS: NEGATIVE

## 2014-03-25 LAB — APTT: APTT: 31 s (ref 24–37)

## 2014-03-31 ENCOUNTER — Ambulatory Visit: Payer: Self-pay | Admitting: Orthopedic Surgery

## 2014-03-31 NOTE — H&P (Signed)
Natasha [name] Old DOB: [birth year] Married / Language: English / Race: White Female Date of Admission:  04/03/2014 CC:  Left Hip Pain History of Present Illness The patient is a 69 year old female who comes in for a preoperative History and Physical. The patient is scheduled for a left total hip arthroplasty (anterior approach) to be performed by Dr. Dione Plover. Aluisio, MD at [facility] on 04-03-2014. The patient is a 69 year old female who presents with a hip problem. The patient was seen in referral from Dr.Norris and for a surgical consult.The patient reports left hip (and left leg) problems including pain (with all ROM in left hip, pain in left leg pain between shin and calf, and left groin pain), weakness and giving way (left). The symptoms began following a specific injury. The injury occured DOI: 11/19/2010, past work comp case due to a fall (DOI: 11/19/2010) while the patient was at work.The symptoms are described as moderate in severity.The patient feels as if their symptoms are feels as if they are improving (patient states that her left leg feels stronger but her left hip pain is still unchanged). Previous workup for this problem has included hip x-rays and hip MRI. Previous treatment for this problem has included physical therapy (currently in PT, in the past went to [facility], also home exercise program). Ms. Hughart initially had problems related to that fall, which was approximately three years ago. She said she did not have any hip issues prior to that. She fell and landed directly on a flexed knee. She "jammed her hip". She developed significant bruising along the lateral aspect of the hip and brings pictures of that with her today. She had been released by [employer] sometime last year. She saw Dr. Veverly Fells earlier this year with increasing pain in her hip. He had ordered an MRI and showed significant degenerative change. She was subsequently referred to me  for evaluation. She has pain in her hip essentially all the time. It is worse with activity. Generally, it is somewhat better at rest. She is not having any lower extremity weakness or paresthesia. The pain does radiate down her thigh with weight bearing. She is not having any problems with her right hip. She has been treated conservatively in the past but despite consservative measures, she still has ongoing pain. It is felt that she would benefit from a total hip replacement.  They have been treated conservatively in the past for the above stated problem and despite conservative measures, they continue to have progressive pain and severe functional limitations and dysfunction. They have failed non-operative management including home exercise, medications. It is felt that they would benefit from undergoing total joint replacement. Risks and benefits of the procedure have been discussed with the patient and they elect to proceed with surgery. There are no active contraindications to surgery such as ongoing infection or rapidly progressive neurological disease.  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 Depression Maternal Grandfather. Cerebrovascular Accident Paternal Grandfather. Heart Disease Paternal Grandfather. Cancer Maternal Grandmother, Mother.  Social History Never consumed alcohol 01/23/2013: Never consumed alcohol Tobacco use Never smoker. 01/23/2013 Marital status married Not under pain contract No history of drug/alcohol rehab Living situation live with spouse Exercise Exercises weekly; does running / walking and other Children 2 Number of flights of stairs before winded 2-3 Current work status working part time  Medication History Pravastatin Sodium (40MG  Tablet, Oral)  Active. Multiple Vitamins/Womens (1 (one) Oral) Active. Calcium-Vitamin D (600MG   Tablet Chewable, Oral) Active. Niacin (50MG  Tablet, Oral) Active. Magnesium Oxide (400MG  Tablet, Oral) Active. Vitamin D3 (2000UNIT Tablet, Oral) Active. Potassium (99MG  Tablet, Oral) Active. Flonase (50MCG/ACT Suspension, Nasal) Active. Benadryl Allergy (25MG  Tablet, Oral) Active. Cranberry Fruit (405MG  Capsule, Oral) Active. Eye Drops (0.05% Solution, Ophthalmic) Active. ZyrTEC (10MG  Tablet, Oral) Active. Tylenol (325MG  Tablet, Oral) Active. Omega 3-6-9 Complex (Oral) Active. Biotin (5000MCG Tablet, Oral) Active. Lutein-Zeaxanthin (25-5MG  Capsule, Oral) Active. Zinc Gluconate (50MG  Tablet, Oral) Active. Super B Complex (Oral) Active. Glucosamine MSM Complex (Oral) Active. Famotidine (20MG  Tablet, Oral) Active. DimenhyDRINATE (50MG  Tablet, Oral) Active.  Past Surgical History Straighten Nasal Septum Date: 13. Sinus Surgery Date: 2010. Rotator Cuff Repair - Right Date: 04/2013. D & C Date: 03/2013.  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 Not Present- Chills, Fatigue, Fever, Memory Loss, Weight Gain and Weight Loss. Skin Not Present- Eczema, Hives, Itching, Lesions and Rash. HEENT Not Present- Dentures, Double Vision, Headache, Hearing Loss, Tinnitus and Visual Loss. Respiratory Not Present- Allergies, Chronic Cough, Coughing up blood, Shortness of breath at rest and Shortness of breath with exertion. Cardiovascular Present- Swelling. Not Present- Chest Pain, Difficulty Breathing Lying Down, Murmur, Palpitations and Racing/skipping heartbeats. Gastrointestinal Not Present- Abdominal Pain, Bloody Stool,  Constipation, Diarrhea, Difficulty Swallowing, Heartburn, Jaundice, Loss of appetitie, Nausea and Vomiting. Female Genitourinary Not Present- Blood in Urine, Discharge, Flank Pain, Incontinence, Painful Urination, Urgency, Urinary frequency, Urinary Retention, Urinating at Night and Weak urinary stream. Musculoskeletal Not Present- Back Pain, Joint Pain, Joint Swelling, Morning Stiffness, Muscle Pain, Muscle Weakness and Spasms. Neurological Not Present- Blackout spells, Difficulty with balance, Dizziness, Paralysis, Tremor and Weakness. Psychiatric Not Present- Insomnia.   Vitals Weight: 160 lb Height: 62in Weight was reported by patient. Height was reported by patient. Body Surface Area: 1.74 m Body Mass Index: 29.26 kg/m  BP: 144/92 (Sitting, Left Arm, Standard)   Physical Exam The physical exam findings are as follows: Note:Patietn is a 69 year old female wtih continued hip pain. Patient is accompanied today by her husband [place].  General Mental Status -Alert, cooperative and good historian. General Appearance-pleasant, Not in acute distress. Orientation-Oriented X3. Build & Nutrition-Well nourished and Well developed.  Head and Neck Head-normocephalic, atraumatic . Neck Global Assessment - supple, no bruit auscultated on the right, no bruit auscultated on the left.  Eye Pupil - Bilateral-Regular and Round. Motion - Bilateral-EOMI.  Chest and Lung Exam Auscultation Breath sounds - clear at anterior chest wall and clear at posterior chest wall. Adventitious sounds - No Adventitious sounds.  Cardiovascular Auscultation Rhythm - Regular rate and rhythm. Heart Sounds - S1 WNL and S2 WNL. Murmurs & Other Heart Sounds - Auscultation of the heart reveals - No Murmurs.  Abdomen Palpation/Percussion Tenderness - Abdomen is non-tender to palpation. Rigidity (guarding) - Abdomen is soft. Auscultation Auscultation of the abdomen reveals - Bowel sounds  normal.  Female Genitourinary Note: Not done, not pertinent to present illness   Musculoskeletal Note: The patient is a well developed, well nourished lady in no acute distress. Her hip range of motion shows full extension with further flexion to 110 degrees with some discomfort in the anterior groin. External rotation to 30 degrees with some discomfort in the anterior groin. Internal  rotation to 10 degrees with discomfort in the anterior groin. Her DTR's are 2+ and symmetrical in the Achilles and patella. Motor strength is 5/5 throughout the lower extremities. Equivocal straight leg raise test, positive on the left at 70 degrees. Negative on the right. Figure of four tests causes pain in the hip and left side of the low back. She can toe and heel walk with balance assistance. Extension of the spine causes pain in the left side of the low back and forward flexion causes pain in the anterior groin and pain down the left leg into the lateral foot.  RADIOGRAPHS: X-rays of her hip are not repeated. We know she has end stage osteoarthritis and she is scheduled for a total hip arthroplasty for this.  AP of the pelvis shows what appears to be significant degenerative disk disease changes in the lumbar spine with symptoms referable to radicular lumbar pain.  AP and lateral LS spine films are obtained today. These reveal marked degenerative disk disease in the lower lumbar spine, especially at L2-3 and moderately severe at L3-4. Severe changes at L4-5 and L5-S1 with foraminal stenosis at both of those levels. Anterior osteophytes.   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 Maury Dus - Patient has been seen preoperatively and felt to be stable for surgery.  IV TXA  Anesthesia Issues: Postop nausea in the past  Signed electronically by Joelene Millin, III  PA-C

## 2014-04-01 NOTE — Progress Notes (Signed)
Pt notified of time change. Instructed to arrive at Short Stay at 7:45 am / NPO after midnight

## 2014-04-03 ENCOUNTER — Inpatient Hospital Stay (HOSPITAL_COMMUNITY): Payer: Worker's Compensation | Admitting: Anesthesiology

## 2014-04-03 ENCOUNTER — Encounter (HOSPITAL_COMMUNITY)
Admission: RE | Disposition: A | Discharge status: Home Health | Payer: Self-pay | Source: Ambulatory Visit | Attending: Orthopedic Surgery

## 2014-04-03 ENCOUNTER — Encounter (HOSPITAL_COMMUNITY): Payer: Self-pay | Admitting: *Deleted

## 2014-04-03 ENCOUNTER — Inpatient Hospital Stay (HOSPITAL_COMMUNITY): Payer: Worker's Compensation

## 2014-04-03 ENCOUNTER — Inpatient Hospital Stay (HOSPITAL_COMMUNITY)
Admission: RE | Admit: 2014-04-03 | Discharge: 2014-04-05 | DRG: 470 | Disposition: A | Discharge status: Home Health | Payer: Worker's Compensation | Source: Ambulatory Visit | Attending: Orthopedic Surgery | Admitting: Orthopedic Surgery

## 2014-04-03 DIAGNOSIS — E785 Hyperlipidemia, unspecified: Secondary | ICD-10-CM | POA: Diagnosis present

## 2014-04-03 DIAGNOSIS — E780 Pure hypercholesterolemia: Secondary | ICD-10-CM | POA: Diagnosis present

## 2014-04-03 DIAGNOSIS — M25552 Pain in left hip: Secondary | ICD-10-CM | POA: Diagnosis present

## 2014-04-03 DIAGNOSIS — M5136 Other intervertebral disc degeneration, lumbar region: Secondary | ICD-10-CM | POA: Diagnosis present

## 2014-04-03 DIAGNOSIS — Z01812 Encounter for preprocedural laboratory examination: Secondary | ICD-10-CM | POA: Diagnosis not present

## 2014-04-03 DIAGNOSIS — Z8619 Personal history of other infectious and parasitic diseases: Secondary | ICD-10-CM

## 2014-04-03 DIAGNOSIS — Z79899 Other long term (current) drug therapy: Secondary | ICD-10-CM | POA: Diagnosis not present

## 2014-04-03 DIAGNOSIS — K219 Gastro-esophageal reflux disease without esophagitis: Secondary | ICD-10-CM | POA: Diagnosis present

## 2014-04-03 DIAGNOSIS — M169 Osteoarthritis of hip, unspecified: Secondary | ICD-10-CM | POA: Diagnosis present

## 2014-04-03 DIAGNOSIS — M1652 Unilateral post-traumatic osteoarthritis, left hip: Secondary | ICD-10-CM | POA: Diagnosis present

## 2014-04-03 DIAGNOSIS — Z96649 Presence of unspecified artificial hip joint: Secondary | ICD-10-CM

## 2014-04-03 DIAGNOSIS — Z8249 Family history of ischemic heart disease and other diseases of the circulatory system: Secondary | ICD-10-CM

## 2014-04-03 DIAGNOSIS — I951 Orthostatic hypotension: Secondary | ICD-10-CM | POA: Diagnosis not present

## 2014-04-03 HISTORY — PX: TOTAL HIP ARTHROPLASTY: SHX124

## 2014-04-03 LAB — TYPE AND SCREEN
ABO/RH(D): B POS
Antibody Screen: NEGATIVE

## 2014-04-03 LAB — ABO/RH: ABO/RH(D): B POS

## 2014-04-03 SURGERY — ARTHROPLASTY, HIP, TOTAL, ANTERIOR APPROACH
Anesthesia: Spinal | Site: Hip | Laterality: Left

## 2014-04-03 MED ORDER — PHENOL 1.4 % MT LIQD: 1.0000 | OROMUCOSAL | Status: DC | PRN

## 2014-04-03 MED ORDER — DEXAMETHASONE SODIUM PHOSPHATE 10 MG/ML IJ SOLN
INTRAMUSCULAR | Status: DC | PRN
  Administered 2014-04-03: 10 mg via INTRAVENOUS

## 2014-04-03 MED ORDER — TRANEXAMIC ACID 100 MG/ML IV SOLN
1000.0000 mg | INTRAVENOUS | Status: DC
  Filled 2014-04-03: qty 10

## 2014-04-03 MED ORDER — MIDAZOLAM HCL 2 MG/2ML IJ SOLN
INTRAMUSCULAR | Status: AC
  Filled 2014-04-03: qty 2

## 2014-04-03 MED ORDER — TRAMADOL HCL 50 MG PO TABS: 50.0000 mg | ORAL_TABLET | Freq: Four times a day (QID) | ORAL | Status: DC | PRN

## 2014-04-03 MED ORDER — POLYETHYLENE GLYCOL 3350 17 G PO PACK
17.0000 g | PACK | Freq: Every day | ORAL | Status: DC | PRN
  Administered 2014-04-03 – 2014-04-04 (×2): 17 g via ORAL
  Filled 2014-04-03 (×2): qty 1

## 2014-04-03 MED ORDER — ONDANSETRON HCL 4 MG/2ML IJ SOLN
INTRAMUSCULAR | Status: AC
  Filled 2014-04-03: qty 2

## 2014-04-03 MED ORDER — OXYCODONE HCL 5 MG/5ML PO SOLN
5.0000 mg | Freq: Once | ORAL | Status: DC | PRN
  Filled 2014-04-03: qty 5

## 2014-04-03 MED ORDER — BUPIVACAINE HCL (PF) 0.5 % IJ SOLN
INTRAMUSCULAR | Status: AC
  Filled 2014-04-03: qty 30

## 2014-04-03 MED ORDER — ONDANSETRON HCL 4 MG/2ML IJ SOLN
INTRAMUSCULAR | Status: DC | PRN
  Administered 2014-04-03: 4 mg via INTRAVENOUS

## 2014-04-03 MED ORDER — CEFAZOLIN SODIUM-DEXTROSE 2-3 GM-% IV SOLR
2.0000 g | INTRAVENOUS | Status: AC
  Administered 2014-04-03: 2 g via INTRAVENOUS

## 2014-04-03 MED ORDER — HYDROMORPHONE HCL 1 MG/ML IJ SOLN
INTRAMUSCULAR | Status: AC
  Filled 2014-04-03: qty 1

## 2014-04-03 MED ORDER — METHOCARBAMOL 500 MG PO TABS
500.0000 mg | ORAL_TABLET | Freq: Four times a day (QID) | ORAL | Status: DC | PRN
  Administered 2014-04-04 – 2014-04-05 (×4): 500 mg via ORAL
  Filled 2014-04-03 (×4): qty 1

## 2014-04-03 MED ORDER — METHOCARBAMOL 1000 MG/10ML IJ SOLN
500.0000 mg | Freq: Four times a day (QID) | INTRAVENOUS | Status: DC | PRN
  Administered 2014-04-03 – 2014-04-04 (×3): 500 mg via INTRAVENOUS
  Filled 2014-04-03 (×6): qty 5

## 2014-04-03 MED ORDER — ACETAMINOPHEN 650 MG RE SUPP: 650.0000 mg | Freq: Four times a day (QID) | RECTAL | Status: DC | PRN

## 2014-04-03 MED ORDER — CEFAZOLIN SODIUM-DEXTROSE 2-3 GM-% IV SOLR
2.0000 g | Freq: Four times a day (QID) | INTRAVENOUS | Status: AC
  Administered 2014-04-03 (×2): 2 g via INTRAVENOUS
  Filled 2014-04-03 (×2): qty 50

## 2014-04-03 MED ORDER — PROPOFOL 10 MG/ML IV BOLUS
INTRAVENOUS | Status: AC
  Filled 2014-04-03: qty 20

## 2014-04-03 MED ORDER — DEXAMETHASONE SODIUM PHOSPHATE 10 MG/ML IJ SOLN
10.0000 mg | Freq: Once | INTRAMUSCULAR | Status: AC
  Administered 2014-04-04: 10 mg via INTRAVENOUS
  Filled 2014-04-03: qty 1

## 2014-04-03 MED ORDER — MENTHOL 3 MG MT LOZG: 1.0000 | LOZENGE | OROMUCOSAL | Status: DC | PRN

## 2014-04-03 MED ORDER — FLEET ENEMA 7-19 GM/118ML RE ENEM: 1.0000 | ENEMA | Freq: Once | RECTAL | Status: AC | PRN

## 2014-04-03 MED ORDER — LACTATED RINGERS IV SOLN
INTRAVENOUS | Status: DC
  Administered 2014-04-03: 11:00:00 via INTRAVENOUS
  Administered 2014-04-03: 1000 mL via INTRAVENOUS

## 2014-04-03 MED ORDER — OXYCODONE HCL 5 MG PO TABS: 5.0000 mg | ORAL_TABLET | Freq: Once | ORAL | Status: DC | PRN

## 2014-04-03 MED ORDER — FAMOTIDINE 20 MG PO TABS
10.0000 mg | ORAL_TABLET | Freq: Two times a day (BID) | ORAL | Status: DC | PRN
  Administered 2014-04-04 – 2014-04-05 (×3): 10 mg via ORAL
  Filled 2014-04-03 (×5): qty 0.5

## 2014-04-03 MED ORDER — METOCLOPRAMIDE HCL 10 MG PO TABS: 5.0000 mg | ORAL_TABLET | Freq: Three times a day (TID) | ORAL | Status: DC | PRN

## 2014-04-03 MED ORDER — DEXAMETHASONE SODIUM PHOSPHATE 10 MG/ML IJ SOLN: 10.0000 mg | Freq: Once | INTRAMUSCULAR | Status: DC

## 2014-04-03 MED ORDER — BUPIVACAINE HCL (PF) 0.25 % IJ SOLN
INTRAMUSCULAR | Status: AC
  Filled 2014-04-03: qty 30

## 2014-04-03 MED ORDER — METOCLOPRAMIDE HCL 5 MG/ML IJ SOLN
5.0000 mg | Freq: Three times a day (TID) | INTRAMUSCULAR | Status: DC | PRN
Start: 2014-04-03 — End: 2014-04-05
  Administered 2014-04-04: 10 mg via INTRAVENOUS
  Filled 2014-04-03: qty 2

## 2014-04-03 MED ORDER — OXYCODONE HCL 5 MG PO TABS
5.0000 mg | ORAL_TABLET | ORAL | Status: DC | PRN
  Administered 2014-04-03 – 2014-04-04 (×4): 10 mg via ORAL
  Filled 2014-04-03 (×2): qty 2
  Filled 2014-04-03: qty 1
  Filled 2014-04-03: qty 2
  Filled 2014-04-03: qty 1

## 2014-04-03 MED ORDER — DEXAMETHASONE SODIUM PHOSPHATE 10 MG/ML IJ SOLN
INTRAMUSCULAR | Status: AC
  Filled 2014-04-03: qty 1

## 2014-04-03 MED ORDER — SODIUM CHLORIDE 0.9 % IV SOLN: INTRAVENOUS | Status: DC

## 2014-04-03 MED ORDER — ONDANSETRON HCL 4 MG/2ML IJ SOLN
4.0000 mg | Freq: Four times a day (QID) | INTRAMUSCULAR | Status: DC | PRN
  Administered 2014-04-04: 4 mg via INTRAVENOUS
  Filled 2014-04-03: qty 2

## 2014-04-03 MED ORDER — ACETAMINOPHEN 10 MG/ML IV SOLN
1000.0000 mg | Freq: Once | INTRAVENOUS | Status: AC
  Administered 2014-04-03: 1000 mg via INTRAVENOUS
  Filled 2014-04-03: qty 100

## 2014-04-03 MED ORDER — DOCUSATE SODIUM 100 MG PO CAPS
100.0000 mg | ORAL_CAPSULE | Freq: Two times a day (BID) | ORAL | Status: DC
  Administered 2014-04-03 – 2014-04-05 (×4): 100 mg via ORAL

## 2014-04-03 MED ORDER — BISACODYL 10 MG RE SUPP: 10.0000 mg | Freq: Every day | RECTAL | Status: DC | PRN

## 2014-04-03 MED ORDER — FENTANYL CITRATE 0.05 MG/ML IJ SOLN
INTRAMUSCULAR | Status: AC
  Filled 2014-04-03: qty 2

## 2014-04-03 MED ORDER — DIMENHYDRINATE 50 MG PO TABS
50.0000 mg | ORAL_TABLET | Freq: Three times a day (TID) | ORAL | Status: DC | PRN
  Administered 2014-04-04: 50 mg via ORAL
  Filled 2014-04-03 (×3): qty 1

## 2014-04-03 MED ORDER — HYDROMORPHONE HCL 1 MG/ML IJ SOLN
0.2500 mg | INTRAMUSCULAR | Status: DC | PRN
  Administered 2014-04-03 (×2): 0.5 mg via INTRAVENOUS

## 2014-04-03 MED ORDER — ACETAMINOPHEN 325 MG PO TABS
650.0000 mg | ORAL_TABLET | Freq: Four times a day (QID) | ORAL | Status: DC | PRN
  Administered 2014-04-04 – 2014-04-05 (×3): 650 mg via ORAL
  Filled 2014-04-03 (×3): qty 2

## 2014-04-03 MED ORDER — EPHEDRINE SULFATE 50 MG/ML IJ SOLN
INTRAMUSCULAR | Status: DC | PRN
  Administered 2014-04-03 (×2): 5 mg via INTRAVENOUS
  Administered 2014-04-03 (×4): 10 mg via INTRAVENOUS

## 2014-04-03 MED ORDER — PROMETHAZINE HCL 25 MG/ML IJ SOLN
6.2500 mg | INTRAMUSCULAR | Status: DC | PRN
Start: 2014-04-03 — End: 2014-04-03

## 2014-04-03 MED ORDER — MORPHINE SULFATE 2 MG/ML IJ SOLN: 1.0000 mg | INTRAMUSCULAR | Status: DC | PRN

## 2014-04-03 MED ORDER — DIPHENHYDRAMINE HCL 12.5 MG/5ML PO ELIX: 12.5000 mg | ORAL_SOLUTION | ORAL | Status: DC | PRN

## 2014-04-03 MED ORDER — PROMETHAZINE HCL 25 MG/ML IJ SOLN
INTRAMUSCULAR | Status: AC
  Administered 2014-04-03: 6.25 mg
  Filled 2014-04-03: qty 1

## 2014-04-03 MED ORDER — BUPIVACAINE HCL (PF) 0.5 % IJ SOLN
INTRAMUSCULAR | Status: DC | PRN
  Administered 2014-04-03: 3 mL

## 2014-04-03 MED ORDER — PROPOFOL INFUSION 10 MG/ML OPTIME
INTRAVENOUS | Status: DC | PRN
  Administered 2014-04-03: 120 ug/kg/min via INTRAVENOUS

## 2014-04-03 MED ORDER — SODIUM CHLORIDE 0.45 % IV SOLN
INTRAVENOUS | Status: DC
  Administered 2014-04-03: 19:00:00 via INTRAVENOUS

## 2014-04-03 MED ORDER — FENTANYL CITRATE 0.05 MG/ML IJ SOLN
INTRAMUSCULAR | Status: DC | PRN
  Administered 2014-04-03 (×2): 50 ug via INTRAVENOUS

## 2014-04-03 MED ORDER — TRANEXAMIC ACID 100 MG/ML IV SOLN
1000.0000 mg | INTRAVENOUS | Status: AC
  Administered 2014-04-03: 1000 mg via INTRAVENOUS
  Filled 2014-04-03: qty 10

## 2014-04-03 MED ORDER — CEFAZOLIN SODIUM-DEXTROSE 2-3 GM-% IV SOLR
INTRAVENOUS | Status: AC
  Filled 2014-04-03: qty 50

## 2014-04-03 MED ORDER — ONDANSETRON HCL 4 MG PO TABS: 4.0000 mg | ORAL_TABLET | Freq: Four times a day (QID) | ORAL | Status: DC | PRN

## 2014-04-03 MED ORDER — BUPIVACAINE LIPOSOME 1.3 % IJ SUSP
20.0000 mL | Freq: Once | INTRAMUSCULAR | Status: DC
  Filled 2014-04-03: qty 20

## 2014-04-03 MED ORDER — RIVAROXABAN 10 MG PO TABS
10.0000 mg | ORAL_TABLET | Freq: Every day | ORAL | Status: DC
  Administered 2014-04-04 – 2014-04-05 (×2): 10 mg via ORAL
  Filled 2014-04-03 (×3): qty 1

## 2014-04-03 MED ORDER — EPHEDRINE SULFATE 50 MG/ML IJ SOLN
INTRAMUSCULAR | Status: AC
  Filled 2014-04-03: qty 1

## 2014-04-03 MED ORDER — MIDAZOLAM HCL 5 MG/5ML IJ SOLN
INTRAMUSCULAR | Status: DC | PRN
  Administered 2014-04-03: 2 mg via INTRAVENOUS

## 2014-04-03 MED ORDER — ACETAMINOPHEN 500 MG PO TABS
1000.0000 mg | ORAL_TABLET | Freq: Four times a day (QID) | ORAL | Status: AC
  Administered 2014-04-03 – 2014-04-04 (×4): 1000 mg via ORAL
  Filled 2014-04-03 (×5): qty 2

## 2014-04-03 MED ORDER — 0.9 % SODIUM CHLORIDE (POUR BTL) OPTIME
TOPICAL | Status: DC | PRN
  Administered 2014-04-03: 1000 mL

## 2014-04-03 MED ORDER — BUPIVACAINE HCL (PF) 0.25 % IJ SOLN
INTRAMUSCULAR | Status: DC | PRN
  Administered 2014-04-03: 20 mL

## 2014-04-03 MED ORDER — KETOROLAC TROMETHAMINE 15 MG/ML IJ SOLN
7.5000 mg | Freq: Four times a day (QID) | INTRAMUSCULAR | Status: AC | PRN
  Administered 2014-04-03: 7.5 mg via INTRAVENOUS

## 2014-04-03 MED ORDER — KETOROLAC TROMETHAMINE 15 MG/ML IJ SOLN
INTRAMUSCULAR | Status: AC
  Filled 2014-04-03: qty 1

## 2014-04-03 SURGICAL SUPPLY — 44 items
BAG DECANTER FOR FLEXI CONT (MISCELLANEOUS) ×3 IMPLANT
BAG SPEC THK2 15X12 ZIP CLS (MISCELLANEOUS)
BAG ZIPLOCK 12X15 (MISCELLANEOUS) IMPLANT
BLADE EXTENDED COATED 6.5IN (ELECTRODE) ×3 IMPLANT
BLADE SAG 18X100X1.27 (BLADE) ×3 IMPLANT
CAPT HIP TOTAL 2 ×2 IMPLANT
CLOSURE STERI-STRIP 1/4X4 (GAUZE/BANDAGES/DRESSINGS) ×2 IMPLANT
CLOSURE WOUND 1/2 X4 (GAUZE/BANDAGES/DRESSINGS) ×1
COVER PERINEAL POST (MISCELLANEOUS) ×3 IMPLANT
DECANTER SPIKE VIAL GLASS SM (MISCELLANEOUS) ×3 IMPLANT
DRAPE C-ARM 42X120 X-RAY (DRAPES) ×3 IMPLANT
DRAPE STERI IOBAN 125X83 (DRAPES) ×3 IMPLANT
DRAPE U-SHAPE 47X51 STRL (DRAPES) ×9 IMPLANT
DRSG ADAPTIC 3X8 NADH LF (GAUZE/BANDAGES/DRESSINGS) ×3 IMPLANT
DRSG MEPILEX BORDER 4X4 (GAUZE/BANDAGES/DRESSINGS) ×3 IMPLANT
DRSG MEPILEX BORDER 4X8 (GAUZE/BANDAGES/DRESSINGS) ×3 IMPLANT
DURAPREP 26ML APPLICATOR (WOUND CARE) ×3 IMPLANT
ELECT REM PT RETURN 9FT ADLT (ELECTROSURGICAL) ×3
ELECTRODE REM PT RTRN 9FT ADLT (ELECTROSURGICAL) ×1 IMPLANT
EVACUATOR 1/8 PVC DRAIN (DRAIN) ×3 IMPLANT
FACESHIELD WRAPAROUND (MASK) ×9 IMPLANT
FACESHIELD WRAPAROUND OR TEAM (MASK) ×4 IMPLANT
GLOVE BIO SURGEON STRL SZ7.5 (GLOVE) ×3 IMPLANT
GLOVE BIO SURGEON STRL SZ8 (GLOVE) ×6 IMPLANT
GLOVE BIOGEL PI IND STRL 8 (GLOVE) ×2 IMPLANT
GLOVE BIOGEL PI INDICATOR 8 (GLOVE) ×4
GOWN STRL REUS W/TWL LRG LVL3 (GOWN DISPOSABLE) ×3 IMPLANT
GOWN STRL REUS W/TWL XL LVL3 (GOWN DISPOSABLE) ×3 IMPLANT
KIT BASIN OR (CUSTOM PROCEDURE TRAY) ×3 IMPLANT
NDL SAFETY ECLIPSE 18X1.5 (NEEDLE) ×2 IMPLANT
NEEDLE HYPO 18GX1.5 SHARP (NEEDLE) ×6
PACK TOTAL JOINT (CUSTOM PROCEDURE TRAY) ×3 IMPLANT
PEN SKIN MARKING BROAD (MISCELLANEOUS) ×3 IMPLANT
STRIP CLOSURE SKIN 1/2X4 (GAUZE/BANDAGES/DRESSINGS) ×2 IMPLANT
SUT ETHIBOND NAB CT1 #1 30IN (SUTURE) ×3 IMPLANT
SUT MNCRL AB 4-0 PS2 18 (SUTURE) ×3 IMPLANT
SUT VIC AB 2-0 CT1 27 (SUTURE) ×6
SUT VIC AB 2-0 CT1 TAPERPNT 27 (SUTURE) ×2 IMPLANT
SUT VLOC 180 0 24IN GS25 (SUTURE) ×3 IMPLANT
SYR 20CC LL (SYRINGE) ×3 IMPLANT
SYR 50ML LL SCALE MARK (SYRINGE) ×1 IMPLANT
TOWEL OR 17X26 10 PK STRL BLUE (TOWEL DISPOSABLE) ×3 IMPLANT
TOWEL OR NON WOVEN STRL DISP B (DISPOSABLE) IMPLANT
TRAY FOLEY CATH 14FRSI W/METER (CATHETERS) ×3 IMPLANT

## 2014-04-03 NOTE — Anesthesia Preprocedure Evaluation (Addendum)
Anesthesia Evaluation  Patient identified by MRN, date of birth, ID band Patient awake    Reviewed: Allergy & Precautions, NPO status , Patient's Chart, lab work & pertinent test results  History of Anesthesia Complications (+) PONV  Airway Mallampati: I  TM Distance: >3 FB Neck ROM: Full    Dental  (+) Teeth Intact, Dental Advisory Given   Pulmonary neg pulmonary ROS,  breath sounds clear to auscultation        Cardiovascular negative cardio ROS  Rhythm:Regular Rate:Normal     Neuro/Psych negative neurological ROS     GI/Hepatic Neg liver ROS, GERD-  ,  Endo/Other  negative endocrine ROS  Renal/GU negative Renal ROS     Musculoskeletal  (+) Arthritis -, Osteoarthritis,    Abdominal   Peds  Hematology negative hematology ROS (+)   Anesthesia Other Findings   Reproductive/Obstetrics                            Anesthesia Physical Anesthesia Plan  ASA: II  Anesthesia Plan: Spinal   Post-op Pain Management:    Induction: Intravenous  Airway Management Planned: Natural Airway and Simple Face Mask  Additional Equipment:   Intra-op Plan:   Post-operative Plan:   Informed Consent: I have reviewed the patients History and Physical, chart, labs and discussed the procedure including the risks, benefits and alternatives for the proposed anesthesia with the patient or authorized representative who has indicated his/her understanding and acceptance.     Plan Discussed with: CRNA  Anesthesia Plan Comments:        Anesthesia Quick Evaluation

## 2014-04-03 NOTE — Anesthesia Procedure Notes (Signed)
Spinal Patient location during procedure: OR Staffing Anesthesiologist: Suzette Battiest Performed by: anesthesiologist  Preanesthetic Checklist Completed: patient identified, site marked, surgical consent, pre-op evaluation, timeout performed, IV checked, risks and benefits discussed and monitors and equipment checked Spinal Block Patient position: sitting Prep: ChloraPrep Patient monitoring: heart rate, continuous pulse ox and blood pressure Approach: midline Location: L4-5 Injection technique: single-shot Needle Needle type: Sprotte  Needle gauge: 24 G Needle length: 9 cm Additional Notes Expiration date of kit checked and confirmed. Patient tolerated procedure well, without complications.

## 2014-04-03 NOTE — H&P (View-Only) (Signed)
Natasha Natasha [name] DOB: 04-30-1945 Married / Language: English / Race: White Female Date of Admission:  04/03/2014 CC:  Left Hip Pain History of Present Illness The patient is a 69 year Natasha [name] female who comes in for a preoperative History and Physical. The patient is scheduled for a left total hip arthroplasty (anterior approach) to be performed by Dr. Dione Plover. Aluisio, MD at [facility] on 04-03-2014. The patient is a 69 year Natasha [name] female who presents with a hip problem. The patient was seen in referral from Dr.Norris and for a surgical consult.The patient reports left hip (and left leg) problems including pain (with all ROM in left hip, pain in left leg pain between shin and calf, and left groin pain), weakness and giving way (left). The symptoms began following a specific injury. The injury occured DOI: 11/19/2010, past work comp case due to a fall (DOI: 11/19/2010) while the patient was at work.The symptoms are described as moderate in severity.The patient feels as if their symptoms are feels as if they are improving (patient states that her left leg feels stronger but her left hip pain is still unchanged). Previous workup for this problem has included hip x-rays and hip MRI. Previous treatment for this problem has included physical therapy (currently in PT, in the past went to [facility], also home exercise program). Natasha Natasha [name] initially had problems related to that fall, which was approximately three years ago. She said she did not have any hip issues prior to that. She fell and landed directly on a flexed knee. She "jammed her hip". She developed significant bruising along the lateral aspect of the hip and brings pictures of that with her today. She had been released by [employer] sometime last year. She saw Dr. Veverly Fells earlier this year with increasing pain in her hip. He had ordered an MRI and showed significant degenerative change. She was subsequently referred to me  for evaluation. She has pain in her hip essentially all the time. It is worse with activity. Generally, it is somewhat better at rest. She is not having any lower extremity weakness or paresthesia. The pain does radiate down her thigh with weight bearing. She is not having any problems with her right hip. She has been treated conservatively in the past but despite consservative measures, she still has ongoing pain. It is felt that she would benefit from a total hip replacement.  They have been treated conservatively in the past for the above stated problem and despite conservative measures, they continue to have progressive pain and severe functional limitations and dysfunction. They have failed non-operative management including home exercise, medications. It is felt that they would benefit from undergoing total joint replacement. Risks and benefits of the procedure have been discussed with the patient and they elect to proceed with surgery. There are no active contraindications to surgery such as ongoing infection or rapidly progressive neurological disease.  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 Depression Maternal Grandfather. Cerebrovascular Accident Paternal Grandfather. Heart Disease Paternal Grandfather. Cancer Maternal Grandmother, Mother.  Social History Never consumed alcohol 01/23/2013: Never consumed alcohol Tobacco use Never smoker. 01/23/2013 Marital status married Not under pain contract No history of drug/alcohol rehab Living situation live with spouse Exercise Exercises weekly; does running / walking and other Children 2 Number of flights of stairs before winded 2-3 Current work status working part time  Medication History Pravastatin Sodium (40MG  Tablet, Oral)  Active. Multiple Vitamins/Womens (1 (one) Oral) Active. Calcium-Vitamin D (600MG   Tablet Chewable, Oral) Active. Niacin (50MG  Tablet, Oral) Active. Magnesium Oxide (400MG  Tablet, Oral) Active. Vitamin D3 (2000UNIT Tablet, Oral) Active. Potassium (99MG  Tablet, Oral) Active. Flonase (50MCG/ACT Suspension, Nasal) Active. Benadryl Allergy (25MG  Tablet, Oral) Active. Cranberry Fruit (405MG  Capsule, Oral) Active. Eye Drops (0.05% Solution, Ophthalmic) Active. ZyrTEC (10MG  Tablet, Oral) Active. Tylenol (325MG  Tablet, Oral) Active. Omega 3-6-9 Complex (Oral) Active. Biotin (5000MCG Tablet, Oral) Active. Lutein-Zeaxanthin (25-5MG  Capsule, Oral) Active. Zinc Gluconate (50MG  Tablet, Oral) Active. Super B Complex (Oral) Active. Glucosamine MSM Complex (Oral) Active. Famotidine (20MG  Tablet, Oral) Active. DimenhyDRINATE (50MG  Tablet, Oral) Active.  Past Surgical History Straighten Nasal Septum Date: 73. Sinus Surgery Date: 2010. Rotator Cuff Repair - Right Date: 04/2013. D & C Date: 03/2013.  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 Not Present- Chills, Fatigue, Fever, Memory Loss, Weight Gain and Weight Loss. Skin Not Present- Eczema, Hives, Itching, Lesions and Rash. HEENT Not Present- Dentures, Double Vision, Headache, Hearing Loss, Tinnitus and Visual Loss. Respiratory Not Present- Allergies, Chronic Cough, Coughing up blood, Shortness of breath at rest and Shortness of breath with exertion. Cardiovascular Present- Swelling. Not Present- Chest Pain, Difficulty Breathing Lying Down, Murmur, Palpitations and Racing/skipping heartbeats. Gastrointestinal Not Present- Abdominal Pain, Bloody Stool,  Constipation, Diarrhea, Difficulty Swallowing, Heartburn, Jaundice, Loss of appetitie, Nausea and Vomiting. Female Genitourinary Not Present- Blood in Urine, Discharge, Flank Pain, Incontinence, Painful Urination, Urgency, Urinary frequency, Urinary Retention, Urinating at Night and Weak urinary stream. Musculoskeletal Not Present- Back Pain, Joint Pain, Joint Swelling, Morning Stiffness, Muscle Pain, Muscle Weakness and Spasms. Neurological Not Present- Blackout spells, Difficulty with balance, Dizziness, Paralysis, Tremor and Weakness. Psychiatric Not Present- Insomnia.   Vitals Weight: 160 lb Height: 62in Weight was reported by patient. Height was reported by patient. Body Surface Area: 1.74 m Body Mass Index: 29.26 kg/m  BP: 144/92 (Sitting, Left Arm, Standard)   Physical Exam The physical exam findings are as follows: Note:Patietn is a 69 year Natasha [name] female wtih continued hip pain. Patient is accompanied today by her husband [place] Carbo.  General Mental Status -Alert, cooperative and good historian. General Appearance-pleasant, Not in acute distress. Orientation-Oriented X3. Build & Nutrition-Well nourished and Well developed.  Head and Neck Head-normocephalic, atraumatic . Neck Global Assessment - supple, no bruit auscultated on the right, no bruit auscultated on the left.  Eye Pupil - Bilateral-Regular and Round. Motion - Bilateral-EOMI.  Chest and Lung Exam Auscultation Breath sounds - clear at anterior chest wall and clear at posterior chest wall. Adventitious sounds - No Adventitious sounds.  Cardiovascular Auscultation Rhythm - Regular rate and rhythm. Heart Sounds - S1 WNL and S2 WNL. Murmurs & Other Heart Sounds - Auscultation of the heart reveals - No Murmurs.  Abdomen Palpation/Percussion Tenderness - Abdomen is non-tender to palpation. Rigidity (guarding) - Abdomen is soft. Auscultation Auscultation of the abdomen reveals - Bowel sounds  normal.  Female Genitourinary Note: Not done, not pertinent to present illness   Musculoskeletal Note: The patient is a well developed, well nourished lady in no acute distress. Her hip range of motion shows full extension with further flexion to 110 degrees with some discomfort in the anterior groin. External rotation to 30 degrees with some discomfort in the anterior groin. Internal  rotation to 10 degrees with discomfort in the anterior groin. Her DTR's are 2+ and symmetrical in the Achilles and patella. Motor strength is 5/5 throughout the lower extremities. Equivocal straight leg raise test, positive on the left at 70 degrees. Negative on the right. Figure of four tests causes pain in the hip and left side of the low back. She can toe and heel walk with balance assistance. Extension of the spine causes pain in the left side of the low back and forward flexion causes pain in the anterior groin and pain down the left leg into the lateral foot.  RADIOGRAPHS: X-rays of her hip are not repeated. We know she has end stage osteoarthritis and she is scheduled for a total hip arthroplasty for this.  AP of the pelvis shows what appears to be significant degenerative disk disease changes in the lumbar spine with symptoms referable to radicular lumbar pain.  AP and lateral LS spine films are obtained today. These reveal marked degenerative disk disease in the lower lumbar spine, especially at L2-3 and moderately severe at L3-4. Severe changes at L4-5 and L5-S1 with foraminal stenosis at both of those levels. Anterior osteophytes.   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 Maury Dus - Patient has been seen preoperatively and felt to be stable for surgery.  IV TXA  Anesthesia Issues: Postop nausea in the past  Signed electronically by Joelene Millin, III  PA-C

## 2014-04-03 NOTE — Interval H&P Note (Signed)
History and Physical Interval Note:  04/03/2014 9:12 AM  Natasha [name]  has presented today for surgery, with the diagnosis of OSTEOARTHRITIS LEFT HIP  The various methods of treatment have been discussed with the patient and family. After consideration of risks, benefits and other options for treatment, the patient has consented to  Procedure(s): LEFT TOTAL HIP ARTHROPLASTY ANTERIOR APPROACH (Left) as a surgical intervention .  The patient's history has been reviewed, patient examined, no change in status, stable for surgery.  I have reviewed the patient's chart and labs.  Questions were answered to the patient's satisfaction.     Gearlean Alf

## 2014-04-03 NOTE — Transfer of Care (Signed)
Immediate Anesthesia Transfer of Care Note  Patient: Natasha [name]  Procedure(s) Performed: Procedure(s): LEFT TOTAL HIP ARTHROPLASTY ANTERIOR APPROACH (Left)  Patient Location: PACU  Anesthesia Type:Spinal  Level of Consciousness: awake, alert , oriented and patient cooperative  Airway & Oxygen Therapy: Patient Spontanous Breathing and Patient connected to face mask oxygen  Post-op Assessment: Report given to RN and Post -op Vital signs reviewed and stable  Post vital signs: Reviewed and stable  Last Vitals:  Filed Vitals:   04/03/14 0739  BP: 161/90  Pulse: 72  Temp: 36.6 C  Resp: 20    Complications: No apparent anesthesia complications

## 2014-04-03 NOTE — Progress Notes (Signed)
Utilization review completed.  

## 2014-04-03 NOTE — Anesthesia Postprocedure Evaluation (Signed)
  Anesthesia Post-op Note  Patient: Natasha [name]  Procedure(s) Performed: Procedure(s): LEFT TOTAL HIP ARTHROPLASTY ANTERIOR APPROACH (Left)  Patient Location: PACU  Anesthesia Type:Spinal  Level of Consciousness: awake and alert   Airway and Oxygen Therapy: Patient Spontanous Breathing  Post-op Pain: mild  Post-op Assessment: Post-op Vital signs reviewed. Spinal resolving with improvement in BLE motor function via activation quadriceps muscles and resolving sensory levels.  Post-op Vital Signs: Reviewed  Last Vitals:  Filed Vitals:   04/03/14 1345  BP:   Pulse:   Temp: 36.3 C  Resp:     Complications: No apparent anesthesia complications

## 2014-04-03 NOTE — Op Note (Signed)
OPERATIVE REPORT  PREOPERATIVE DIAGNOSIS: Osteoarthritis of the Left hip.   POSTOPERATIVE DIAGNOSIS: Osteoarthritis of the Left  hip.   PROCEDURE: Left total hip arthroplasty, anterior approach.   SURGEON: Gaynelle Arabian, MD   ASSISTANT: Molli Barrows, PA-C  ANESTHESIA:  Spinal  ESTIMATED BLOOD LOSS:-125 ml   DRAINS: Hemovac x1.   COMPLICATIONS: None   CONDITION: PACU - hemodynamically stable.   BRIEF CLINICAL NOTE: Natasha [name] is a 69 y.o. female who has advanced end-  stage arthritis of her Left  hip with progressively worsening pain and  dysfunction.The patient has failed nonoperative management and presents for  total hip arthroplasty.   PROCEDURE IN DETAIL: After successful administration of spinal  anesthetic, the traction boots for the [facility] bed were placed on both  feet and the patient was placed onto the [facility] bed, boots placed into the leg  holders. The Left hip was then isolated from the perineum with plastic  drapes and prepped and draped in the usual sterile fashion. ASIS and  greater trochanter were marked and a oblique incision was made, starting  at about 1 cm lateral and 2 cm distal to the ASIS and coursing towards  the anterior cortex of the femur. The skin was cut with a 10 blade  through subcutaneous tissue to the level of the fascia overlying the  tensor fascia lata muscle. The fascia was then incised in line with the  incision at the junction of the anterior third and posterior 2/3rd. The  muscle was teased off the fascia and then the interval between the TFL  and the rectus was developed. The Hohmann retractor was then placed at  the top of the femoral neck over the capsule. The vessels overlying the  capsule were cauterized and the fat on top of the capsule was removed.  A Hohmann retractor was then placed anterior underneath the rectus  femoris to give exposure to the entire anterior capsule. A T-shaped  capsulotomy was performed. The  edges were tagged and the femoral head  was identified.       Osteophytes are removed off the superior acetabulum.  The femoral neck was then cut in situ with an oscillating saw. Traction  was then applied to the left lower extremity utilizing the [facility]  traction. The femoral head was then removed. Retractors were placed  around the acetabulum and then circumferential removal of the labrum was  performed. Osteophytes were also removed. Reaming starts at 45 mm to  medialize and  Increased in 2 mm increments to 51 mm. We reamed in  approximately 40 degrees of abduction, 20 degrees anteversion. A 52 mm  pinnacle acetabular shell was then impacted in anatomic position under  fluoroscopic guidance with excellent purchase. We did not need to place  any additional dome screws. A 32 mm neutral + 4 marathon liner was then  placed into the acetabular shell.       The femoral lift was then placed along the lateral aspect of the femur  just distal to the vastus ridge. The leg was  externally rotated and capsule  was stripped off the inferior aspect of the femoral neck down to the  level of the lesser trochanter, this was done with electrocautery. The femur was lifted after this was performed. The  leg was then placed and extended in adducted position to essentially delivering the femur. We also removed the capsule superiorly and the  piriformis from the piriformis  fossa to gain excellent exposure of the  proximal femur. Rongeur was used to remove some cancellous bone to get  into the lateral portion of the proximal femur for placement of the  initial starter reamer. The starter broaches was placed  the starter broach  and was shown to go down the center of the canal. Broaching  with the  Corail system was then performed starting at size 8, coursing  Up to size 12. A size 12 had excellent torsional and rotational  and axial stability. The trial standard offset neck was then placed  with a 32 + 5 trial  head. The hip was then reduced. We confirmed that  the stem was in the canal both on AP and lateral x-rays. It also has excellent sizing. The hip was reduced with outstanding stability through full extension, full external rotation,  and then flexion in adduction internal rotation. AP pelvis was taken  and the leg lengths were measured and found to be exactly equal. Hip  was then dislocated again and the femoral head and neck removed. The  femoral broach was removed. Size 12 Corail stem with a standard offset  neck was then impacted into the femur following native anteversion. Has  excellent purchase in the canal. Excellent torsional and rotational and  axial stability. It is confirmed to be in the canal on AP and lateral  fluoroscopic views. The 32 + 5 ceramic head was placed and the hip  reduced with outstanding stability. Again AP pelvis was taken and it  confirmed that the leg lengths were equal. The wound was then copiously  irrigated with saline solution and the capsule reattached and repaired  with Ethibond suture. 30 ml of .25% Bupivicaine was injected into the capsule and into the edge of the tensor fascia lata as well as subcutaneous tissue. The fascia overlying the tensor fascia lata was  then closed with a running #1 V-Loc. Subcu was closed with interrupted  2-0 Vicryl and subcuticular running 4-0 Monocryl. Incision was cleaned  and dried. Steri-Strips and a bulky sterile dressing applied. Hemovac  drain was hooked to suction and then he was awakened and transported to  recovery in stable condition.        Please note that a surgical assistant was a medical necessity for this procedure to perform it in a safe and expeditious manner. Assistant was necessary to provide appropriate retraction of vital neurovascular structures and to prevent femoral fracture and allow for anatomic placement of the prosthesis.  Gaynelle Arabian, M.D.

## 2014-04-04 ENCOUNTER — Encounter (HOSPITAL_COMMUNITY): Payer: Self-pay | Admitting: Orthopedic Surgery

## 2014-04-04 LAB — BASIC METABOLIC PANEL WITH GFR
Anion gap: 6 (ref 5–15)
BUN: 16 mg/dL (ref 6–23)
CO2: 26 mmol/L (ref 19–32)
Calcium: 8.5 mg/dL (ref 8.4–10.5)
Chloride: 100 mmol/L (ref 96–112)
Creatinine, Ser: 0.77 mg/dL (ref 0.50–1.10)
GFR calc Af Amer: >90 mL/min
GFR calc non Af Amer: 84 mL/min — ABNORMAL LOW
Glucose, Bld: 173 mg/dL — ABNORMAL HIGH (ref 70–99)
Potassium: 4.2 mmol/L (ref 3.5–5.1)
Sodium: 132 mmol/L — ABNORMAL LOW (ref 135–145)

## 2014-04-04 LAB — CBC
HCT: 33.5 % — ABNORMAL LOW (ref 36.0–46.0)
Hemoglobin: 11.3 g/dL — ABNORMAL LOW (ref 12.0–15.0)
MCH: 31.7 pg (ref 26.0–34.0)
MCHC: 33.7 g/dL (ref 30.0–36.0)
MCV: 94.1 fL (ref 78.0–100.0)
Platelets: 256 10*3/uL (ref 150–400)
RBC: 3.56 MIL/uL — ABNORMAL LOW (ref 3.87–5.11)
RDW: 14.5 % (ref 11.5–15.5)
WBC: 12.5 10*3/uL — AB (ref 4.0–10.5)

## 2014-04-04 MED ORDER — LORATADINE 10 MG PO TABS
10.0000 mg | ORAL_TABLET | Freq: Every day | ORAL | Status: DC
  Administered 2014-04-04 – 2014-04-05 (×2): 10 mg via ORAL
  Filled 2014-04-04 (×2): qty 1

## 2014-04-04 MED ORDER — METHOCARBAMOL 500 MG PO TABS: 500.0000 mg | ORAL_TABLET | Freq: Four times a day (QID) | ORAL | Status: DC | PRN

## 2014-04-04 MED ORDER — OXYCODONE HCL 5 MG PO TABS: 5.0000 mg | ORAL_TABLET | ORAL | Status: DC | PRN

## 2014-04-04 MED ORDER — RIVAROXABAN 10 MG PO TABS: 10.0000 mg | ORAL_TABLET | Freq: Every day | ORAL | Status: DC

## 2014-04-04 MED ORDER — SODIUM CHLORIDE 0.9 % IV BOLUS (SEPSIS)
250.0000 mL | Freq: Once | INTRAVENOUS | Status: AC
  Administered 2014-04-04: 250 mL via INTRAVENOUS

## 2014-04-04 MED ORDER — TRAMADOL HCL 50 MG PO TABS: 50.0000 mg | ORAL_TABLET | Freq: Four times a day (QID) | ORAL | Status: DC | PRN

## 2014-04-04 NOTE — Discharge Instructions (Addendum)
INSTRUCTIONS AFTER JOINT REPLACEMENT  ° °o Remove items at home which could result in a fall. This includes throw rugs or furniture in walking pathways °o ICE to the affected joint every three hours while awake for 30 minutes at a time, for at least the first 3-5 days, and then as needed for pain and swelling.  Continue to use ice for pain and swelling. You may notice swelling that will progress down to the foot and ankle.  This is normal after surgery.  Elevate your leg when you are not up walking on it.   °o Continue to use the breathing machine you got in the hospital (incentive spirometer) which will help keep your temperature down.  It is common for your temperature to cycle up and down following surgery, especially at night when you are not up moving around and exerting yourself.  The breathing machine keeps your lungs expanded and your temperature down. ° ° °DIET:  As you were doing prior to hospitalization, we recommend a well-balanced diet. ° °DRESSING / WOUND CARE / SHOWERING ° °You may shower 3 days after surgery, but keep the wounds dry during showering.  You may use an occlusive plastic wrap (Press'n Seal for example), NO SOAKING/SUBMERGING IN THE BATHTUB.  If the bandage gets wet, change with a clean dry gauze.  If the incision gets wet, pat the wound dry with a clean towel. ° °ACTIVITY ° °o Increase activity slowly as tolerated, but follow the weight bearing instructions below.   °o No driving for 6 weeks or until further direction given by your physician.  You cannot drive while taking narcotics.  °o No lifting or carrying greater than 10 lbs. until further directed by your surgeon. °o Avoid periods of inactivity such as sitting longer than an hour when not asleep. This helps prevent blood clots.  °o You may return to work once you are authorized by your doctor.  ° ° ° °WEIGHT BEARING  ° °Weight bearing as tolerated with assist device (walker, cane, etc) as directed, use it as long as suggested by  your surgeon or therapist, typically at least 4-6 weeks. ° ° °EXERCISES ° °Results after joint replacement surgery are often greatly improved when you follow the exercise, range of motion and muscle strengthening exercises prescribed by your doctor. Safety measures are also important to protect the joint from further injury. Any time any of these exercises cause you to have increased pain or swelling, decrease what you are doing until you are comfortable again and then slowly increase them. If you have problems or questions, call your caregiver or physical therapist for advice.  ° °Rehabilitation is important following a joint replacement. After just a few days of immobilization, the muscles of the leg can become weakened and shrink (atrophy).  These exercises are designed to build up the tone and strength of the thigh and leg muscles and to improve motion. Often times heat used for twenty to thirty minutes before working out will loosen up your tissues and help with improving the range of motion but do not use heat for the first two weeks following surgery (sometimes heat can increase post-operative swelling).  ° °These exercises can be done on a training (exercise) mat, on the floor, on a table or on a bed. Use whatever works the best and is most comfortable for you.    Use music or television while you are exercising so that the exercises are a pleasant break in your day. This will   make your life better with the exercises acting as a break in your routine that you can look forward to.   Perform all exercises about fifteen times, three times per day or as directed.  You should exercise both the operative leg and the other leg as well. ° °Exercises include: °  °• Quad Sets - Tighten up the muscle on the front of the thigh (Quad) and hold for 5-10 seconds.   °• Straight Leg Raises - With your knee straight (if you were given a brace, keep it on), lift the leg to 60 degrees, hold for 3 seconds, and slowly lower the  leg.  Perform this exercise against resistance later as your leg gets stronger.  °• Leg Slides: Lying on your back, slowly slide your foot toward your buttocks, bending your knee up off the floor (only go as far as is comfortable). Then slowly slide your foot back down until your leg is flat on the floor again.  °• Angel Wings: Lying on your back spread your legs to the side as far apart as you can without causing discomfort.  °• Hamstring Strength:  Lying on your back, push your heel against the floor with your leg straight by tightening up the muscles of your buttocks.  Repeat, but this time bend your knee to a comfortable angle, and push your heel against the floor.  You may put a pillow under the heel to make it more comfortable if necessary.  ° °A rehabilitation program following joint replacement surgery can speed recovery and prevent re-injury in the future due to weakened muscles. Contact your doctor or a physical therapist for more information on knee rehabilitation.  ° ° °CONSTIPATION ° °Constipation is defined medically as fewer than three stools per week and severe constipation as less than one stool per week.  Even if you have a regular bowel pattern at home, your normal regimen is likely to be disrupted due to multiple reasons following surgery.  Combination of anesthesia, postoperative narcotics, change in appetite and fluid intake all can affect your bowels.  ° °YOU MUST use at least one of the following options; they are listed in order of increasing strength to get the job done.  They are all available over the counter, and you may need to use some, POSSIBLY even all of these options:   ° °Drink plenty of fluids (prune juice may be helpful) and high fiber foods °Colace 100 mg by mouth twice a day  °Senokot for constipation as directed and as needed Dulcolax (bisacodyl), take with full glass of water  °Miralax (polyethylene glycol) once or twice a day as needed. ° °If you have tried all these things  and are unable to have a bowel movement in the first 3-4 days after surgery call either your surgeon or your primary doctor.   ° °If you experience loose stools or diarrhea, hold the medications until you stool forms back up.  If your symptoms do not get better within 1 week or if they get worse, check with your doctor.  If you experience "the worst abdominal pain ever" or develop nausea or vomiting, please contact the office immediately for further recommendations for treatment. ° ° °ITCHING:  If you experience itching with your medications, try taking only a single pain pill, or even half a pain pill at a time.  You can also use Benadryl over the counter for itching or also to help with sleep.  ° °TED HOSE STOCKINGS:  Use stockings   on both legs until for at least 2 weeks or as directed by physician office. They may be removed at night for sleeping.  MEDICATIONS:  See your medication summary on the After Visit Summary that nursing will review with you.  You may have some home medications which will be placed on hold until you complete the course of blood thinner medication.  It is important for you to complete the blood thinner medication as prescribed.  PRECAUTIONS:  If you experience chest pain or shortness of breath - call 911 immediately for transfer to the hospital emergency department.   If you develop a fever greater that 101 F, purulent drainage from wound, increased redness or drainage from wound, foul odor from the wound/dressing, or calf pain - CONTACT YOUR SURGEON.                                                   FOLLOW-UP APPOINTMENTS:  If you do not already have a post-op appointment, please call the office for an appointment to be seen by your surgeon.  Guidelines for how soon to be seen are listed in your After Visit Summary, but are typically between 1-4 weeks after surgery.   MAKE SURE YOU:   Understand these instructions.   Get help right away if you are not doing well or get  worse.    Thank you for letting us be a part of your medical care team.  It is a privilege we respect greatly.  We hope these instructions will help you stay on track for a fast and full recovery!  _______________________  Information on my medicine - XARELTO (Rivaroxaban)  This medication education was reviewed with me or my healthcare representative as part of my discharge preparation.    Why was Xarelto prescribed for you? Xarelto was prescribed for you to reduce the risk of blood clots forming after orthopedic surgery. The medical term for these abnormal blood clots is venous thromboembolism (VTE).  What do you need to know about xarelto ? Take your Xarelto ONCE DAILY at the same time every day. You may take it either with or without food.  If you have difficulty swallowing the tablet whole, you may crush it and mix in applesauce just prior to taking your dose.  Take Xarelto exactly as prescribed by your doctor and DO NOT stop taking Xarelto without talking to the doctor who prescribed the medication.  Stopping without other VTE prevention medication to take the place of Xarelto may increase your risk of developing a clot.  After discharge, you should have regular check-up appointments with your healthcare provider that is prescribing your Xarelto.    What do you do if you miss a dose? If you miss a dose, take it as soon as you remember on the same day then continue your regularly scheduled once daily regimen the next day. Do not take two doses of Xarelto on the same day.   Important Safety Information A possible side effect of Xarelto is bleeding. You should call your healthcare provider right away if you experience any of the following: ? Bleeding from an injury or your nose that does not stop. ? Unusual colored urine (red or dark brown) or unusual colored stools (red or black). ? Unusual bruising for unknown reasons. ? A serious fall or if you hit your head (  even if  there is no bleeding).  Some medicines may interact with Xarelto and might increase your risk of bleeding while on Xarelto. To help avoid this, consult your healthcare provider or pharmacist prior to using any new prescription or non-prescription medications, including herbals, vitamins, non-steroidal anti-inflammatory drugs (NSAIDs) and supplements.  This website has more information on Xarelto: [email or web address]

## 2014-04-04 NOTE — Evaluation (Signed)
Physical Therapy Evaluation Patient Details Name: Natasha [name] MRN: [id number] DOB: 08/03/1945 Today's Date: 04/04/2014   History of Present Illness  L THr  Clinical Impression  Pt s/p L THR presents with decreased L LE strength/ROM and post op pain limiting functional mobility.  Pt should progress well to dc home with family assist and HHPT follow up.    Follow Up Recommendations Home health PT    Equipment Recommendations  Rolling walker with 5" wheels    Recommendations for Other Services OT consult     Precautions / Restrictions Precautions Precautions: Fall Restrictions Weight Bearing Restrictions: No Other Position/Activity Restrictions: WBAT      Mobility  Bed Mobility Overal bed mobility: Needs Assistance Bed Mobility: Sit to Supine       Sit to supine: Min assist   General bed mobility comments: cues for sequence and use of R LE to self assist  Transfers Overall transfer level: Needs assistance Equipment used: Rolling walker (2 wheeled) Transfers: Sit to/from Stand Sit to Stand: Min assist         General transfer comment: cues for LE management and use of UEs to self assist  Ambulation/Gait Ambulation/Gait assistance: Min assist Ambulation Distance (Feet): 90 Feet Assistive device: Rolling walker (2 wheeled) Gait Pattern/deviations: Step-to pattern;Step-through pattern;Decreased step length - right;Decreased step length - left;Shuffle     General Gait Details: cues for posture and position from RW  Stairs            Wheelchair Mobility    Modified Rankin (Stroke Patients Only)       Balance                                             Pertinent Vitals/Pain Pain Assessment: 0-10 Pain Score: 4  Pain Location: L hip Pain Descriptors / Indicators: Aching;Sore Pain Intervention(s): Limited activity within patient's tolerance;Monitored during session;Premedicated before session;Ice applied    Home Living  Family/patient expects to be discharged to:: Private residence Living Arrangements: Spouse/significant other Available Help at Discharge: Family Type of Home: House Home Access: Stairs to enter Entrance Stairs-Rails: Right Entrance Stairs-Number of Steps: 5 Home Layout: Able to live on main level with bedroom/bathroom Home Equipment: Cane - single point      Prior Function Level of Independence: Independent;Independent with assistive device(s)               Hand Dominance   Dominant Hand: Right    Extremity/Trunk Assessment   Upper Extremity Assessment: Overall WFL for tasks assessed           Lower Extremity Assessment: LLE deficits/detail      Cervical / Trunk Assessment: Normal  Communication   Communication: No difficulties  Cognition Arousal/Alertness: Awake/alert Behavior During Therapy: WFL for tasks assessed/performed Overall Cognitive Status: Within Functional Limits for tasks assessed                      General Comments      Exercises        Assessment/Plan    PT Assessment Patient needs continued PT services  PT Diagnosis Difficulty walking   PT Problem List Decreased strength;Decreased range of motion;Decreased activity tolerance;Decreased mobility;Decreased knowledge of use of DME;Pain  PT Treatment Interventions DME instruction;Gait training;Stair training;Functional mobility training;Therapeutic activities;Therapeutic exercise;Patient/family education   PT Goals (Current goals can be found in the  Care Plan section) Acute Rehab PT Goals Patient Stated Goal: Resume previous lifestyle with decreased pain PT Goal Formulation: With patient Time For Goal Achievement: 04/11/14 Potential to Achieve Goals: Good    Frequency 7X/week   Barriers to discharge        Co-evaluation               End of Session Equipment Utilized During Treatment: Gait belt Activity Tolerance: Other (comment) (nausea) Patient left: in  bed;with call bell/phone within reach;with family/visitor present Nurse Communication: Mobility status         Time: 1044-1100 PT Time Calculation (min) (ACUTE ONLY): 16 min   Charges:   PT Evaluation $Initial PT Evaluation Tier I: 1 Procedure     PT G Codes:        Montravious Weigelt May 04, 2014, 1:03 PM

## 2014-04-04 NOTE — Care Management Note (Signed)
    Page 1 of 2   04/04/2014     1:38:23 PM CARE MANAGEMENT NOTE 04/04/2014  Patient:  Natasha [name], Natasha [name]   Account Number:  [id number]  Date Initiated:  04/04/2014  Documentation initiated by:  [facility] [place]  Subjective/Objective Assessment:   adm: LEFT TOTAL HIP ARTHROPLASTY ANTERIOR APPROACH (Left)     Action/Plan:   discharge planning   Anticipated DC Date:  04/05/2014   Anticipated DC Plan:  [place]  CM consult      [facility] Choice  HOME HEALTH   Choice offered to / List presented to:     DME arranged  3-N-1  [place] arranged  HH-2 PT      Status of service:  Completed, signed off Medicare Important Message given?   (If response is "NO", the following Medicare IM given date fields will be blank) Date Medicare IM given:   Medicare IM given by:   Date Additional Medicare IM given:   Additional Medicare IM given by:    Discharge Disposition:  [place]  Per UR Regulation:    If discussed at Long Length of Stay Meetings, dates discussed:    Comments:  04/04/14 13:50 Cm met with pt's spouse (pt sleeping) who states he has no CM contact; he states he goes through the attorney for his wife's needs.  CM  called [place] [phone] and was given [place] [place] at [phone] and fax [phone].  CM called [facility] and left voicemail with identifyer as the [facility] claim number and stated I faxed the facesheet, discharge plan of 04/05/14.  Requested DME (3n1, rolling walker, and shower stool) please be delivered to pt's room prior to discharge, face to face, HHPT orders, and DME orders, OP note, H&P, and progress note.  No other CM needs were communicated.  Mariane Masters, BSN, CM [phone].

## 2014-04-04 NOTE — Progress Notes (Signed)
OT Cancellation Note  Patient Details Name: AYRIANNA MCGINNISS MRN: [id number] DOB: 12-11-1945   Cancelled Treatment:    Reason Eval/Treat Not Completed: Pain limiting ability to participate - Attempted to see pt x 3.  Pt with increased pain, then working with PT, then with n/v on third attempt.  Will reattempt eval in am.  Darlina Rumpf [place], OTR/L [phone]  04/04/2014, 2:34 PM

## 2014-04-04 NOTE — Progress Notes (Signed)
   Subjective: 1 Day Post-Op Procedure(s) (LRB): LEFT TOTAL HIP ARTHROPLASTY ANTERIOR APPROACH (Left) Patient reports pain as mild.   Patient seen in rounds with Dr. Wynelle Link. Patient is well, and has had no acute complaints or problems other than some dizziness upon standing. No issues overnight. No SOB or chest pain. Would like her Zyrtec. Plan is to go Home after hospital stay.  Objective: Vital signs in last 24 hours: Temp:  [96 F (35.6 C)-98.4 F (36.9 C)] 98.4 F (36.9 C) (03/31 0458) Pulse Rate:  [60-74] 60 (03/31 0458) Resp:  [13-17] 14 (03/31 0458) BP: (96-144)/(51-97) 97/51 mmHg (03/31 0458) SpO2:  [98 %-100 %] 99 % (03/31 0739) Weight:  [74.39 kg (164 lb)] 74.39 kg (164 lb) (03/30 1416)  Intake/Output from previous day:  Intake/Output Summary (Last 24 hours) at 04/04/14 0811 Last data filed at 04/04/14 6767  Gross per 24 hour  Intake 3495.42 ml  Output   5805 ml  Net -2309.58 ml     Labs:  Recent Labs  04/04/14 0438  HGB 11.3*    Recent Labs  04/04/14 0438  WBC 12.5*  RBC 3.56*  HCT 33.5*  PLT 256    Recent Labs  04/04/14 0438  NA 132*  K 4.2  CL 100  CO2 26  BUN 16  CREATININE 0.77  GLUCOSE 173*  CALCIUM 8.5    EXAM General - Patient is Alert and Oriented Extremity - Neurologically intact Intact pulses distally Dorsiflexion/Plantar flexion intact Dressing - dressing C/D/I Motor Function - intact, moving foot and toes well on exam.  Hemovac pulled without difficulty.  Past Medical History  Diagnosis Date  . SVD (spontaneous vaginal delivery)     x 2  . Seasonal allergies     "year around"  . Hyperlipidemia     under control  . Lumbar spinal stenosis   . Neck pain   . Chronic cystitis     no problems  . Tinnitus     with daily aspirin intake  . History of shingles   . Cataract     "being monitored"-bilateral  . Internal hemorrhoids   . Diverticulosis     no problems  . Incontinence of urine     sometimes  .  Arthritis     osteoarthritis L hip- job injury  . DDD (degenerative disc disease), cervical   . Measles   . Mumps   . Rubella   . History of chicken pox   . GERD (gastroesophageal reflux disease)     occasional - diet controlled - no meds  . PONV (postoperative nausea and vomiting)     "hard time waking up with first colonoscopy-no problems since"  . History of positive PPD     Assessment/Plan: 1 Day Post-Op Procedure(s) (LRB): LEFT TOTAL HIP ARTHROPLASTY ANTERIOR APPROACH (Left) Principal Problem:   OA (osteoarthritis) of hip  Estimated body mass index is 26.48 kg/(m^2) as calculated from the following:   Height as of this encounter: 5\' 6"  (1.676 m).   Weight as of this encounter: 74.39 kg (164 lb). Advance diet Up with therapy D/C IV fluids when tolerating POs well  DVT Prophylaxis - Xarelto Weight Bearing As Tolerated   Will give bolus to help with orthostatic hypotension. PT today. Plan for DC home tomorrow.   Ardeen Jourdain, PA-C Orthopaedic Surgery 04/04/2014, 8:11 AM

## 2014-04-04 NOTE — Progress Notes (Signed)
Physical Therapy Treatment Patient Details Name: Natasha [name] MRN: [id number] DOB: 24-Jul-1945 Today's Date: 04-09-14    History of Present Illness L THr    PT Comments    Pt moving well but ltd by ongoing N&V.  Follow Up Recommendations  Home health PT     Equipment Recommendations  Rolling walker with 5" wheels    Recommendations for Other Services OT consult     Precautions / Restrictions Precautions Precautions: Fall Restrictions Weight Bearing Restrictions: No Other Position/Activity Restrictions: WBAT    Mobility  Bed Mobility Overal bed mobility: Needs Assistance Bed Mobility: Supine to Sit;Sit to Supine     Supine to sit: Min guard Sit to supine: Min guard   General bed mobility comments: cues for sequence and use of R LE to self assist  Transfers Overall transfer level: Needs assistance Equipment used: Rolling walker (2 wheeled) Transfers: Sit to/from Stand Sit to Stand: Min guard         General transfer comment: cues for LE management and use of UEs to self assist  Ambulation/Gait Ambulation/Gait assistance: Min assist Ambulation Distance (Feet): 40 Feet Assistive device: Rolling walker (2 wheeled) Gait Pattern/deviations: Step-to pattern;Step-through pattern;Decreased step length - right;Decreased step length - left;Shuffle;Trunk flexed Gait velocity: decr   General Gait Details: cues for posture and position from [employer]            Wheelchair Mobility    Modified Rankin (Stroke Patients Only)       Balance                                    Cognition Arousal/Alertness: Awake/alert Behavior During Therapy: WFL for tasks assessed/performed Overall Cognitive Status: Within Functional Limits for tasks assessed                      Exercises Total Joint Exercises Ankle Circles/Pumps: AROM;Both;15 reps;Supine Quad Sets: AROM;Both;10 reps;Supine Heel Slides: AAROM;15 reps;Supine;Left Hip  ABduction/ADduction: AAROM;Left;15 reps;Supine    General Comments        Pertinent Vitals/Pain Pain Assessment: 0-10 Pain Score: 4  Pain Location: L hip Pain Descriptors / Indicators: Aching;Sore Pain Intervention(s): Limited activity within patient's tolerance;Monitored during session;Premedicated before session;Ice applied    Home Living                      Prior Function            PT Goals (current goals can now be found in the care plan section) Acute Rehab PT Goals Patient Stated Goal: Resume previous lifestyle with decreased pain PT Goal Formulation: With patient Time For Goal Achievement: 04/11/14 Potential to Achieve Goals: Good Progress towards PT goals: Progressing toward goals    Frequency  7X/week    PT Plan Current plan remains appropriate    Co-evaluation             End of Session Equipment Utilized During Treatment: Gait belt Activity Tolerance: Other (comment) (N&V) Patient left: in bed;with call bell/phone within reach;with family/visitor present     Time: 1342-1411 PT Time Calculation (min) (ACUTE ONLY): 29 min  Charges:  $Gait Training: 8-22 mins $Therapeutic Exercise: 8-22 mins                    G Codes:      Adon Gehlhausen 04/09/2014, 2:44 PM

## 2014-04-05 LAB — CBC
HEMATOCRIT: 33.2 % — AB (ref 36.0–46.0)
HEMOGLOBIN: 11.3 g/dL — AB (ref 12.0–15.0)
MCH: 31.9 pg (ref 26.0–34.0)
MCHC: 34 g/dL (ref 30.0–36.0)
MCV: 93.8 fL (ref 78.0–100.0)
Platelets: 304 10*3/uL (ref 150–400)
RBC: 3.54 MIL/uL — ABNORMAL LOW (ref 3.87–5.11)
RDW: 14.8 % (ref 11.5–15.5)
WBC: 12.5 10*3/uL — AB (ref 4.0–10.5)

## 2014-04-05 LAB — BASIC METABOLIC PANEL
ANION GAP: 6 (ref 5–15)
BUN: 13 mg/dL (ref 6–23)
CALCIUM: 8.9 mg/dL (ref 8.4–10.5)
CO2: 27 mmol/L (ref 19–32)
Chloride: 104 mmol/L (ref 96–112)
Creatinine, Ser: 0.68 mg/dL (ref 0.50–1.10)
GFR calc Af Amer: >90 mL/min (ref >90)
GFR, EST NON AFRICAN AMERICAN: 88 mL/min — AB (ref >90)
Glucose, Bld: 121 mg/dL — ABNORMAL HIGH (ref 70–99)
Potassium: 4.2 mmol/L (ref 3.5–5.1)
Sodium: 137 mmol/L (ref 135–145)

## 2014-04-05 MED ORDER — HYDROCODONE-ACETAMINOPHEN 5-325 MG PO TABS
1.0000 | ORAL_TABLET | ORAL | Status: DC | PRN
  Administered 2014-04-05: 1 via ORAL
  Filled 2014-04-05: qty 1

## 2014-04-05 MED ORDER — HYDROCODONE-ACETAMINOPHEN 5-325 MG PO TABS: 1.0000 | ORAL_TABLET | ORAL | Status: DC | PRN

## 2014-04-05 NOTE — Evaluation (Signed)
Occupational Therapy Evaluation Patient Details Name: Natasha [name] MRN: [id number] DOB: [birth year] Today's Date: 04/05/2014    History of Present Illness L THr   Clinical Impression   .Pt admitted with above.  She is able to perform LB ADLs and functional mobility with min guard assist.  Spouse is supportive and able to provide needed level of assist at discharge.  Recommend Tub transfer bench for home use as it is the safest option, and reduces risk for fall; recommend reacher to allow pt to access items from floor to reduce risk of fall, and recommend Hand held shower.   No further OT needs identified.  Will sign off at this time.     Follow Up Recommendations  No OT follow up;Supervision/Assistance - 24 hour    Equipment Recommendations  Tub/shower bench;Other (comment) (reacher, and Hand held shower )    Recommendations for Other Services       Precautions / Restrictions Precautions Precautions: Fall Restrictions Weight Bearing Restrictions: No Other Position/Activity Restrictions: WBAT      Mobility Bed Mobility                  Transfers Overall transfer level: Needs assistance Equipment used: Rolling walker (2 wheeled) Transfers: Sit to/from [facility] Sit to Stand: Min guard Stand pivot transfers: Min guard            Balance                                            ADL Overall ADL's : Needs assistance/impaired Eating/Feeding: Independent   Grooming: Wash/dry hands;Wash/dry face;Oral care;Brushing hair;Min guard;Standing   Upper Body Bathing: Set up;Sitting   Lower Body Bathing: Min guard;Sit to/from stand   Upper Body Dressing : Sitting;Set up   Lower Body Dressing: Min guard;Sit to/from stand   Toilet Transfer: Min guard;Ambulation;Comfort height toilet;Regular Toilet;BSC;RW   Toileting- Clothing Manipulation and Hygiene: Min guard;Sit to/from stand   Tub/ Shower Transfer: Tub transfer;Min  guard;Minimal assistance;Ambulation;Shower seat;Tub bench;Grab bars;Rolling walker Tub/Shower Transfer Details (indicate cue type and reason): Requires min A with stepping over tub with clamp on grab bar and shower seat, and min guard assist using tub transfer bence  Functional mobility during ADLs: Min guard;Rolling walker General ADL Comments: Pt is able to access lt foot for LB ADLs.  Discussed options for DME at home.  Both pt and spouse agree that a tub transfer bench is a safer option, reducing risk of fall than a tub seat and clamp on grab bar -RN and CM notified.  Also recommend Hand held shower and reacher to allow pt to access items from floor and reduce risk of falling until she is strong and stable enough to bend forward     Vision     Perception     Praxis      Pertinent Vitals/Pain Pain Assessment: 0-10 Pain Score: 4  Pain Location: Lt hip  Pain Descriptors / Indicators: Aching Pain Intervention(s): Monitored during session;Ice applied     Hand Dominance Right   Extremity/Trunk Assessment Upper Extremity Assessment Upper Extremity Assessment: Overall WFL for tasks assessed   Lower Extremity Assessment Lower Extremity Assessment: Defer to PT evaluation   Cervical / Trunk Assessment Cervical / Trunk Assessment: Normal   Communication Communication Communication: No difficulties   Cognition Arousal/Alertness: Awake/alert Behavior During Therapy: WFL for tasks assessed/performed Overall Cognitive  Status: Within Functional Limits for tasks assessed                     General Comments       Exercises       Shoulder Instructions      Home Living Family/patient expects to be discharged to:: Private residence Living Arrangements: Spouse/significant other Available Help at Discharge: Family Type of Home: House Home Access: Stairs to enter Technical brewer of Steps: 5 Entrance Stairs-Rails: Right Home Layout: Able to live on main level with  bedroom/bathroom     Bathroom Shower/Tub: Tub/shower unit;Curtain Shower/tub characteristics: Architectural technologist: Standard     Home Equipment: Cane - single point          Prior Functioning/Environment Level of Independence: Independent;Independent with assistive device(s)             OT Diagnosis: Generalized weakness;Acute pain   OT Problem List: Decreased strength;Pain   OT Treatment/Interventions:      OT Goals(Current goals can be found in the care plan section) Acute Rehab OT Goals Patient Stated Goal: to regain independence and reduce pain  OT Goal Formulation: All assessment and education complete, DC therapy  OT Frequency:     Barriers to D/C:            Co-evaluation              End of Session Equipment Utilized During Treatment: Rolling walker Nurse Communication: Mobility status  Activity Tolerance: Patient tolerated treatment well Patient left: in chair;with call bell/phone within reach;with family/visitor present   Time: 1021-1173 OT Time Calculation (min): 48 min Charges:  OT General Charges $OT Visit: 1 Procedure OT Evaluation $Initial OT Evaluation Tier I: 1 Procedure OT Treatments $Self Care/Home Management : 23-37 mins G-Codes:    Natasha [name] M Apr 10, 2014, 10:49 AM

## 2014-04-05 NOTE — Progress Notes (Signed)
RN reviewed discharge instructions with patient and family. All questions answered.  Paperwork and prescriptions given.   NT rolled patient down in wheelchair with all belongings and equipment to family car.

## 2014-04-05 NOTE — Progress Notes (Signed)
   Subjective: 2 Days Post-Op Procedure(s) (LRB): LEFT TOTAL HIP ARTHROPLASTY ANTERIOR APPROACH (Left) Patient reports pain as mild.   Has nausea with oxycodone. Has tolerated hydrocodone in past. Will switch to hydrocodone Plan is to go Home after hospital stay.  Objective: Vital signs in last 24 hours: Temp:  [97.7 F (36.5 C)-98.5 F (36.9 C)] 98.5 F (36.9 C) (04/01 0430) Pulse Rate:  [61-97] 61 (04/01 0430) Resp:  [14-18] 16 (04/01 0430) BP: (111-134)/(63-76) 134/76 mmHg (04/01 0430) SpO2:  [95 %-100 %] 95 % (04/01 0430)  Intake/Output from previous day:  Intake/Output Summary (Last 24 hours) at 04/05/14 0735 Last data filed at 04/05/14 0430  Gross per 24 hour  Intake   1895 ml  Output   4450 ml  Net  -2555 ml    Intake/Output this shift:    Labs:  Recent Labs  04/04/14 0438 04/05/14 0430  HGB 11.3* 11.3*    Recent Labs  04/04/14 0438 04/05/14 0430  WBC 12.5* 12.5*  RBC 3.56* 3.54*  HCT 33.5* 33.2*  PLT 256 304    Recent Labs  04/04/14 0438 04/05/14 0430  NA 132* 137  K 4.2 4.2  CL 100 104  CO2 26 27  BUN 16 13  CREATININE 0.77 0.68  GLUCOSE 173* 121*  CALCIUM 8.5 8.9   No results for input(s): LABPT, INR in the last 72 hours.  EXAM General - Patient is Alert, Appropriate and Oriented Extremity - Neurologically intact Neurovascular intact No cellulitis present Compartment soft Dressing/Incision - clean, dry, no drainage Motor Function - intact, moving foot and toes well on exam.   Past Medical History  Diagnosis Date  . SVD (spontaneous vaginal delivery)     x 2  . Seasonal allergies     "year around"  . Hyperlipidemia     under control  . Lumbar spinal stenosis   . Neck pain   . Chronic cystitis     no problems  . Tinnitus     with daily aspirin intake  . History of shingles   . Cataract     "being monitored"-bilateral  . Internal hemorrhoids   . Diverticulosis     no problems  . Incontinence of urine     sometimes    . Arthritis     osteoarthritis L hip- job injury  . DDD (degenerative disc disease), cervical   . Measles   . Mumps   . Rubella   . History of chicken pox   . GERD (gastroesophageal reflux disease)     occasional - diet controlled - no meds  . PONV (postoperative nausea and vomiting)     "hard time waking up with first colonoscopy-no problems since"  . History of positive PPD     Assessment/Plan: 2 Days Post-Op Procedure(s) (LRB): LEFT TOTAL HIP ARTHROPLASTY ANTERIOR APPROACH (Left) Principal Problem:   OA (osteoarthritis) of hip   Advance diet D/C IV fluids Discharge home with home health  DVT Prophylaxis - Xarelto Weight Bearing As Tolerated left Leg  Jupiter Kabir V 04/05/2014, 7:35 AM

## 2014-04-05 NOTE — Progress Notes (Signed)
Physical Therapy Treatment Patient Details Name: Natasha [name] MRN: [id number] DOB: [birth year] Today's Date: 04/05/2014    History of Present Illness L THr    PT Comments    POD # 2 pt eager to D/C to home.  Spouse present and had him assist pt with amb and stairs.  Assisted OOB to amb to BR.  Assisted with amb in hallway then practiced going up/down 4 steps one rail.  Returned to room then performed THR TE's following HEP handout.  Instructed on proper tech and use of ICE after.  All other Therapy questions addressed. Pt ready for D/C to home.   Follow Up Recommendations  Home health PT     Equipment Recommendations  Rolling walker with 5" wheels    Recommendations for Other Services       Precautions / Restrictions Precautions Precautions: Fall Restrictions Weight Bearing Restrictions: No Other Position/Activity Restrictions: WBAT    Mobility  Bed Mobility Overal bed mobility: Needs Assistance Bed Mobility: Supine to Sit     Supine to sit: Supervision     General bed mobility comments: increased time  Transfers Overall transfer level: Needs assistance Equipment used: Rolling walker (2 wheeled) Transfers: Sit to/from Stand Sit to Stand: Supervision Stand pivot transfers: Min guard       General transfer comment: good use of hands to steady self   Ambulation/Gait Ambulation/Gait assistance: Supervision;Min guard Ambulation Distance (Feet): 85 Feet Assistive device: Rolling walker (2 wheeled) Gait Pattern/deviations: Step-to pattern;Step-through pattern Gait velocity: decreased but functional   General Gait Details: <25% VC's on safety with turns and proper walker to self distance   Stairs Stairs: Yes Stairs assistance: Min assist Stair Management: One rail Right;Step to pattern;Forwards Number of Stairs: 4 General stair comments: with spouse.  Demonstrated and instructed on proper sequencing and safety  Wheelchair Mobility    Modified Rankin  (Stroke Patients Only)       Balance                                    Cognition Arousal/Alertness: Awake/alert Behavior During Therapy: WFL for tasks assessed/performed Overall Cognitive Status: Within Functional Limits for tasks assessed                      Exercises   Total Hip Replacement TE's 10 reps ankle pumps 10 reps knee presses 10 reps heel slides 10 reps SAQ's 10 reps ABD Followed by ICE     General Comments General comments (skin integrity, edema, etc.): spouse present for eval       Pertinent Vitals/Pain Pain Assessment: 0-10 Pain Score: 3  Pain Location: L hip Pain Descriptors / Indicators: Aching;Sore Pain Intervention(s): Monitored during session;Premedicated before session;Repositioned;Ice applied    Home Living Family/patient expects to be discharged to:: Private residence Living Arrangements: Spouse/significant other Available Help at Discharge: Family Type of Home: House Home Access: Stairs to enter Entrance Stairs-Rails: Right Home Layout: Able to live on main level with bedroom/bathroom Home Equipment: Cane - single point      Prior Function Level of Independence: Independent;Independent with assistive device(s)          PT Goals (current goals can now be found in the care plan section) Acute Rehab PT Goals Patient Stated Goal: to regain independence and reduce pain  Progress towards PT goals: Progressing toward goals    Frequency  7X/week    PT  Plan      Co-evaluation             End of Session Equipment Utilized During Treatment: Gait belt Activity Tolerance: Patient tolerated treatment well Patient left: in chair;with call bell/phone within reach;with family/visitor present     Time: 5621-3086 PT Time Calculation (min) (ACUTE ONLY): 42 min  Charges:  $Gait Training: 8-22 mins $Therapeutic Exercise: 8-22 mins $Therapeutic Activity: 8-22 mins                    G Codes:      Rica Koyanagi   PTA [facility]  Acute  Rehab Pager      [phone]

## 2014-04-07 NOTE — Discharge Summary (Signed)
Physician Discharge Summary   Patient ID: Natasha [name] MRN: [id number] DOB/AGE: Mar 18, 1945 69 y.o.  Admit date: 04/03/2014 Discharge date: 04/05/2014  Primary Diagnosis: primary osteoarthritis, left hip   Admission Diagnoses:  Past Medical History  Diagnosis Date  . SVD (spontaneous vaginal delivery)     x 2  . Seasonal allergies     "year around"  . Hyperlipidemia     under control  . Lumbar spinal stenosis   . Neck pain   . Chronic cystitis     no problems  . Tinnitus     with daily aspirin intake  . History of shingles   . Cataract     "being monitored"-bilateral  . Internal hemorrhoids   . Diverticulosis     no problems  . Incontinence of urine     sometimes  . Arthritis     osteoarthritis L hip- job injury  . DDD (degenerative disc disease), cervical   . Measles   . Mumps   . Rubella   . History of chicken pox   . GERD (gastroesophageal reflux disease)     occasional - diet controlled - no meds  . PONV (postoperative nausea and vomiting)     "hard time waking up with first colonoscopy-no problems since"  . History of positive PPD    Discharge Diagnoses:   Principal Problem:   OA (osteoarthritis) of hip  Estimated body mass index is 26.48 kg/(m^2) as calculated from the following:   Height as of this encounter: 5' 6"  (1.676 m).   Weight as of this encounter: 74.39 kg (164 lb).  Procedure:  Procedure(s) (LRB): LEFT TOTAL HIP ARTHROPLASTY ANTERIOR APPROACH (Left)   Consults: None  HPI: The patient is a 69 year old female who comes in for a preoperative History and Physical. The patient is scheduled for a left total hip arthroplasty (anterior approach) to be performed by Dr. Dione Plover. Aluisio, MD at [facility] on 04-03-2014. The patient is a 69 year old female who presents with a hip problem. The patient was seen in referral from Dr.Norris and for a surgical consult.The patient reports left hip (and left leg) problems including pain (with all ROM  in left hip, pain in left leg pain between shin and calf, and left groin pain), weakness and giving way (left). The symptoms began following a specific injury. The injury occured DOI: 11/19/2010, past work comp case due to a fall (DOI: 11/19/2010) while the patient was at work.The symptoms are described as moderate in severity.The patient feels as if their symptoms are feels as if they are improving (patient states that her left leg feels stronger but her left hip pain is still unchanged). Previous workup for this problem has included hip x-rays and hip MRI. Previous treatment for this problem has included physical therapy (currently in PT, in the past went to [facility], also home exercise program). Ms. Birkel initially had problems related to that fall, which was approximately three years ago. She said she did not have any hip issues prior to that. She fell and landed directly on a flexed knee. She "jammed her hip". She developed significant bruising along the lateral aspect of the hip and brings pictures of that with her today. She had been released by [employer] sometime last year. She saw Dr. Veverly Fells earlier this year with increasing pain in her hip. He had ordered an MRI and showed significant degenerative change. She was subsequently referred to me for evaluation. She  has pain in her hip essentially all the time. It is worse with activity. Generally, it is somewhat better at rest. She is not having any lower extremity weakness or paresthesia. The pain does radiate down her thigh with weight bearing. She is not having any problems with her right hip. She has been treated conservatively in the past but despite consservative measures, she still has ongoing pain. It is felt that she would benefit from a total hip replacement.  They have been treated conservatively in the past for the above stated problem and despite conservative measures, they continue to have progressive  pain and severe functional limitations and dysfunction. They have failed non-operative management including home exercise, medications. It is felt that they would benefit from undergoing total joint replacement. Risks and benefits of the procedure have been discussed with the patient and they elect to proceed with surgery. There are no active contraindications to surgery such as ongoing infection or rapidly progressive neurological disease. Laboratory Data: Admission on 04/03/2014, Discharged on 04/05/2014  Component Date Value Ref Range Status  . ABO/RH(D) 04/03/2014 B POS   Final  . Antibody Screen 04/03/2014 NEG   Final  . Sample Expiration 04/03/2014 04/06/2014   Final  . ABO/RH(D) 04/03/2014 B POS   Final  . WBC 04/04/2014 12.5* 4.0 - 10.5 K/uL Final  . RBC 04/04/2014 3.56* 3.87 - 5.11 MIL/uL Final  . Hemoglobin 04/04/2014 11.3* 12.0 - 15.0 g/dL Final  . HCT 04/04/2014 33.5* 36.0 - 46.0 % Final  . MCV 04/04/2014 94.1  78.0 - 100.0 fL Final  . MCH 04/04/2014 31.7  26.0 - 34.0 pg Final  . MCHC 04/04/2014 33.7  30.0 - 36.0 g/dL Final  . RDW 04/04/2014 14.5  11.5 - 15.5 % Final  . Platelets 04/04/2014 256  150 - 400 K/uL Final  . Sodium 04/04/2014 132* 135 - 145 mmol/L Final  . Potassium 04/04/2014 4.2  3.5 - 5.1 mmol/L Final  . Chloride 04/04/2014 100  96 - 112 mmol/L Final  . CO2 04/04/2014 26  19 - 32 mmol/L Final  . Glucose, Bld 04/04/2014 173* 70 - 99 mg/dL Final  . BUN 04/04/2014 16  6 - 23 mg/dL Final  . Creatinine, Ser 04/04/2014 0.77  0.50 - 1.10 mg/dL Final  . Calcium 04/04/2014 8.5  8.4 - 10.5 mg/dL Final  . GFR calc non Af Amer 04/04/2014 84* >90 mL/min Final  . GFR calc Af Amer 04/04/2014 >90  >90 mL/min Final   Comment: (NOTE) The eGFR has been calculated using the CKD EPI equation. This calculation has not been validated in all clinical situations. eGFR's persistently <90 mL/min signify possible Chronic Kidney Disease.   . Anion gap 04/04/2014 6  5 - 15 Final  . WBC  04/05/2014 12.5* 4.0 - 10.5 K/uL Final  . RBC 04/05/2014 3.54* 3.87 - 5.11 MIL/uL Final  . Hemoglobin 04/05/2014 11.3* 12.0 - 15.0 g/dL Final  . HCT 04/05/2014 33.2* 36.0 - 46.0 % Final  . MCV 04/05/2014 93.8  78.0 - 100.0 fL Final  . MCH 04/05/2014 31.9  26.0 - 34.0 pg Final  . MCHC 04/05/2014 34.0  30.0 - 36.0 g/dL Final  . RDW 04/05/2014 14.8  11.5 - 15.5 % Final  . Platelets 04/05/2014 304  150 - 400 K/uL Final  . Sodium 04/05/2014 137  135 - 145 mmol/L Final  . Potassium 04/05/2014 4.2  3.5 - 5.1 mmol/L Final  . Chloride 04/05/2014 104  96 - 112 mmol/L Final  .  CO2 04/05/2014 27  19 - 32 mmol/L Final  . Glucose, Bld 04/05/2014 121* 70 - 99 mg/dL Final  . BUN 04/05/2014 13  6 - 23 mg/dL Final  . Creatinine, Ser 04/05/2014 0.68  0.50 - 1.10 mg/dL Final  . Calcium 04/05/2014 8.9  8.4 - 10.5 mg/dL Final  . GFR calc non Af Amer 04/05/2014 88* >90 mL/min Final  . GFR calc Af Amer 04/05/2014 >90  >90 mL/min Final   Comment: (NOTE) The eGFR has been calculated using the CKD EPI equation. This calculation has not been validated in all clinical situations. eGFR's persistently <90 mL/min signify possible Chronic Kidney Disease.   Georgiann Hahn gap 04/05/2014 6  5 - 15 Final  Hospital Outpatient Visit on 03/25/2014  Component Date Value Ref Range Status  . MRSA, PCR 03/25/2014 NEGATIVE  NEGATIVE Final  . Staphylococcus aureus 03/25/2014 NEGATIVE  NEGATIVE Final   Comment:        The Xpert SA Assay (FDA approved for NASAL specimens in patients over 54 years of age), is one component of a comprehensive surveillance program.  Test performance has been validated by [facility] for patients greater than or equal to 6 year old. It is not intended to diagnose infection nor to guide or monitor treatment.   Marland Kitchen aPTT 03/25/2014 31  24 - 37 seconds Final  . WBC 03/25/2014 7.0  4.0 - 10.5 K/uL Final  . RBC 03/25/2014 4.16  3.87 - 5.11 MIL/uL Final  . Hemoglobin 03/25/2014 13.2  12.0 - 15.0 g/dL  Final  . HCT 03/25/2014 39.6  36.0 - 46.0 % Final  . MCV 03/25/2014 95.2  78.0 - 100.0 fL Final  . MCH 03/25/2014 31.7  26.0 - 34.0 pg Final  . MCHC 03/25/2014 33.3  30.0 - 36.0 g/dL Final  . RDW 03/25/2014 14.6  11.5 - 15.5 % Final  . Platelets 03/25/2014 296  150 - 400 K/uL Final  . Sodium 03/25/2014 144  135 - 145 mmol/L Final  . Potassium 03/25/2014 4.7  3.5 - 5.1 mmol/L Final  . Chloride 03/25/2014 106  96 - 112 mmol/L Final  . CO2 03/25/2014 29  19 - 32 mmol/L Final  . Glucose, Bld 03/25/2014 100* 70 - 99 mg/dL Final  . BUN 03/25/2014 15  6 - 23 mg/dL Final  . Creatinine, Ser 03/25/2014 0.95  0.50 - 1.10 mg/dL Final  . Calcium 03/25/2014 9.9  8.4 - 10.5 mg/dL Final  . Total Protein 03/25/2014 7.0  6.0 - 8.3 g/dL Final  . Albumin 03/25/2014 4.1  3.5 - 5.2 g/dL Final  . AST 03/25/2014 24  0 - 37 U/L Final  . ALT 03/25/2014 20  0 - 35 U/L Final  . Alkaline Phosphatase 03/25/2014 106  39 - 117 U/L Final  . Total Bilirubin 03/25/2014 0.5  0.3 - 1.2 mg/dL Final  . GFR calc non Af Amer 03/25/2014 60* >90 mL/min Final  . GFR calc Af Amer 03/25/2014 70* >90 mL/min Final   Comment: (NOTE) The eGFR has been calculated using the CKD EPI equation. This calculation has not been validated in all clinical situations. eGFR's persistently <90 mL/min signify possible Chronic Kidney Disease.   . Anion gap 03/25/2014 9  5 - 15 Final  . Prothrombin Time 03/25/2014 12.6  11.6 - 15.2 seconds Final  . INR 03/25/2014 0.93  0.00 - 1.49 Final  . Color, Urine 03/25/2014 YELLOW  YELLOW Final  . APPearance 03/25/2014 CLEAR  CLEAR Final  . Specific  Gravity, Urine 03/25/2014 1.005  1.005 - 1.030 Final  . pH 03/25/2014 7.5  5.0 - 8.0 Final  . Glucose, UA 03/25/2014 NEGATIVE  NEGATIVE mg/dL Final  . Hgb urine dipstick 03/25/2014 NEGATIVE  NEGATIVE Final  . Bilirubin Urine 03/25/2014 NEGATIVE  NEGATIVE Final  . Ketones, ur 03/25/2014 NEGATIVE  NEGATIVE mg/dL Final  . Protein, ur 03/25/2014 NEGATIVE   NEGATIVE mg/dL Final  . Urobilinogen, UA 03/25/2014 0.2  0.0 - 1.0 mg/dL Final  . Nitrite 03/25/2014 NEGATIVE  NEGATIVE Final  . Leukocytes, UA 03/25/2014 NEGATIVE  NEGATIVE Final   MICROSCOPIC NOT DONE ON URINES WITH NEGATIVE PROTEIN, BLOOD, LEUKOCYTES, NITRITE, OR GLUCOSE <1000 mg/dL.     X-Rays:Dg Pelvis Portable  04/03/2014   CLINICAL DATA:  Hip replacement.  EXAM: PORTABLE PELVIS 1-2 VIEWS  COMPARISON:  None.  FINDINGS: Total left hip replacement. Good anatomic alignment. Degenerative changes noted of the right hip. No acute bony or joint abnormality identified.  IMPRESSION: Total left hip replacement with good anatomic alignment. Hardware intact. Degenerative changes in the right hip. No acute abnormality.   Electronically Signed   By: Marcello Moores  Register   On: 04/03/2014 11:59   Dg C-arm 1-60 Min-no Report  04/03/2014   CLINICAL DATA: surgery   C-ARM 1-60 MINUTES  Fluoroscopy was utilized by the requesting physician.  No radiographic  interpretation.      Hospital Course: MAUREEN DELATTE is a 69 y.o. who was admitted to [facility]. They were brought to the operating room on 04/03/2014 and underwent Procedure(s): LEFT TOTAL HIP ARTHROPLASTY ANTERIOR APPROACH.  Patient tolerated the procedure well and was later transferred to the recovery room and then to the orthopaedic floor for postoperative care.  They were given PO and IV analgesics for pain control following their surgery.  They were given 24 hours of postoperative antibiotics of  Anti-infectives    Start     Dose/Rate Route Frequency Ordered Stop   04/03/14 1600  ceFAZolin (ANCEF) IVPB 2 g/50 mL premix     2 g 100 mL/hr over 30 Minutes Intravenous Every 6 hours 04/03/14 1429 04/03/14 2136   04/03/14 0748  ceFAZolin (ANCEF) IVPB 2 g/50 mL premix     2 g 100 mL/hr over 30 Minutes Intravenous On call to O.R. 04/03/14 1610 04/03/14 1004     and started on DVT prophylaxis in the form of Xarelto.   PT and OT were ordered for  total joint protocol.  Discharge planning consulted to help with postop disposition and equipment needs.  Patient had a fair night on the evening of surgery.  They started to get up OOB with therapy on day one. Hemovac drain was pulled without difficulty.  Continued to work with therapy into day two. Had some issues with nausea but improved with change from Oxy to [place] was changed on day two and the incision was clean and dry.  Patient was seen in rounds and was ready to go home.   Diet: Regular diet Activity:WBAT Follow-up:in 2 weeks Disposition - Home Discharged Condition: stable   Discharge Instructions    Call MD / Call 911    Complete by:  As directed   If you experience chest pain or shortness of breath, CALL 911 and be transported to the hospital emergency room.  If you develope a fever above 101 F, pus (white drainage) or increased drainage or redness at the wound, or calf pain, call your surgeon's office.     Constipation Prevention  Complete by:  As directed   Drink plenty of fluids.  Prune juice may be helpful.  You may use a stool softener, such as Colace (over the counter) 100 mg twice a day.  Use MiraLax (over the counter) for constipation as needed.     Diet general    Complete by:  As directed      Discharge instructions    Complete by:  As directed   Ronks items at home which could result in a fall. This includes throw rugs or furniture in walking pathways ICE to the affected joint every three hours while awake for 30 minutes at a time, for at least the first 3-5 days, and then as needed for pain and swelling.  Continue to use ice for pain and swelling. You may notice swelling that will progress down to the foot and ankle.  This is normal after surgery.  Elevate your leg when you are not up walking on it.   Continue to use the breathing machine you got in the hospital (incentive spirometer) which will help keep your  temperature down.  It is common for your temperature to cycle up and down following surgery, especially at night when you are not up moving around and exerting yourself.  The breathing machine keeps your lungs expanded and your temperature down.   DIET:  As you were doing prior to hospitalization, we recommend a well-balanced diet.  DRESSING / WOUND CARE / SHOWERING  You may shower 3 days after surgery, but keep the wounds dry during showering.  You may use an occlusive plastic wrap (Press'n Seal for example), NO SOAKING/SUBMERGING IN THE BATHTUB.  If the bandage gets wet, change with a clean dry gauze.  If the incision gets wet, pat the wound dry with a clean towel.  ACTIVITY  Increase activity slowly as tolerated, but follow the weight bearing instructions below.   No driving for 6 weeks or until further direction given by your physician.  You cannot drive while taking narcotics.  No lifting or carrying greater than 10 lbs. until further directed by your surgeon. Avoid periods of inactivity such as sitting longer than an hour when not asleep. This helps prevent blood clots.  You may return to work once you are authorized by your doctor.     WEIGHT BEARING   Weight bearing as tolerated with assist device (walker, cane, etc) as directed, use it as long as suggested by your surgeon or therapist, typically at least 4-6 weeks.   EXERCISES  Results after joint replacement surgery are often greatly improved when you follow the exercise, range of motion and muscle strengthening exercises prescribed by your doctor. Safety measures are also important to protect the joint from further injury. Any time any of these exercises cause you to have increased pain or swelling, decrease what you are doing until you are comfortable again and then slowly increase them. If you have problems or questions, call your caregiver or physical therapist for advice.   Rehabilitation is important following a joint  replacement. After just a few days of immobilization, the muscles of the leg can become weakened and shrink (atrophy).  These exercises are designed to build up the tone and strength of the thigh and leg muscles and to improve motion. Often times heat used for twenty to thirty minutes before working out will loosen up your tissues and help with improving the range of motion but do not use heat for  the first two weeks following surgery (sometimes heat can increase post-operative swelling).   These exercises can be done on a training (exercise) mat, on the floor, on a table or on a bed. Use whatever works the best and is most comfortable for you.    Use music or television while you are exercising so that the exercises are a pleasant break in your day. This will make your life better with the exercises acting as a break in your routine that you can look forward to.   Perform all exercises about fifteen times, three times per day or as directed.  You should exercise both the operative leg and the other leg as well.   Exercises include:   Quad Sets - Tighten up the muscle on the front of the thigh (Quad) and hold for 5-10 seconds.   Straight Leg Raises - With your knee straight (if you were given a brace, keep it on), lift the leg to 60 degrees, hold for 3 seconds, and slowly lower the leg.  Perform this exercise against resistance later as your leg gets stronger.  Leg Slides: Lying on your back, slowly slide your foot toward your buttocks, bending your knee up off the floor (only go as far as is comfortable). Then slowly slide your foot back down until your leg is flat on the floor again.  Angel Wings: Lying on your back spread your legs to the side as far apart as you can without causing discomfort.  Hamstring Strength:  Lying on your back, push your heel against the floor with your leg straight by tightening up the muscles of your buttocks.  Repeat, but this time bend your knee to a comfortable angle, and  push your heel against the floor.  You may put a pillow under the heel to make it more comfortable if necessary.   A rehabilitation program following joint replacement surgery can speed recovery and prevent re-injury in the future due to weakened muscles. Contact your doctor or a physical therapist for more information on knee rehabilitation.    CONSTIPATION  Constipation is defined medically as fewer than three stools per week and severe constipation as less than one stool per week.  Even if you have a regular bowel pattern at home, your normal regimen is likely to be disrupted due to multiple reasons following surgery.  Combination of anesthesia, postoperative narcotics, change in appetite and fluid intake all can affect your bowels.   YOU MUST use at least one of the following options; they are listed in order of increasing strength to get the job done.  They are all available over the counter, and you may need to use some, POSSIBLY even all of these options:    Drink plenty of fluids (prune juice may be helpful) and high fiber foods Colace 100 mg by mouth twice a day  Senokot for constipation as directed and as needed Dulcolax (bisacodyl), take with full glass of water  Miralax (polyethylene glycol) once or twice a day as needed.  If you have tried all these things and are unable to have a bowel movement in the first 3-4 days after surgery call either your surgeon or your primary doctor.    If you experience loose stools or diarrhea, hold the medications until you stool forms back up.  If your symptoms do not get better within 1 week or if they get worse, check with your doctor.  If you experience "the worst abdominal pain ever" or develop nausea  or vomiting, please contact the office immediately for further recommendations for treatment.   ITCHING:  If you experience itching with your medications, try taking only a single pain pill, or even half a pain pill at a time.  You can also use  Benadryl over the counter for itching or also to help with sleep.   TED HOSE STOCKINGS:  Use stockings on both legs until for at least 2 weeks or as directed by physician office. They may be removed at night for sleeping.  MEDICATIONS:  See your medication summary on the "After Visit Summary" that nursing will review with you.  You may have some home medications which will be placed on hold until you complete the course of blood thinner medication.  It is important for you to complete the blood thinner medication as prescribed.  PRECAUTIONS:  If you experience chest pain or shortness of breath - call 911 immediately for transfer to the hospital emergency department.   If you develop a fever greater that 101 F, purulent drainage from wound, increased redness or drainage from wound, foul odor from the wound/dressing, or calf pain - CONTACT YOUR SURGEON.                                                   FOLLOW-UP APPOINTMENTS:  If you do not already have a post-op appointment, please call the office for an appointment to be seen by your surgeon.  Guidelines for how soon to be seen are listed in your "After Visit Summary", but are typically between 1-4 weeks after surgery.   MAKE SURE YOU:  Understand these instructions.  Get help right away if you are not doing well or get worse.    Thank you for letting us be a part of your medical care team.  It is a privilege we respect greatly.  We hope these instructions will help you stay on track for a fast and full recovery!     Increase activity slowly as tolerated    Complete by:  As directed             Medication List    STOP taking these medications        aspirin-acetaminophen-caffeine 250-250-65 MG per tablet  Commonly known as:  EXCEDRIN MIGRAINE     b complex vitamins tablet     BIOTIN PO     calcium-vitamin D 500-200 MG-UNIT per tablet  Commonly known as:  OSCAL WITH D     Cranberry 500 MG Caps     FISH OIL PO      Glucosamine-MSM 1500-500 MG/30ML Liqd     ibuprofen 600 MG tablet  Commonly known as:  ADVIL,MOTRIN     LUTEIN-ZEAXANTHIN PO     Magnesium 400 MG Caps     multivitamin with minerals Tabs tablet     niacin 500 MG tablet     Polyethyl Glycol-Propyl Glycol 0.4-0.3 % Soln     Potassium 99 MG Tabs     Vitamin D3 5000 UNITS Caps     Zinc 40 MG Tabs      TAKE these medications        acetaminophen 650 MG CR tablet  Commonly known as:  TYLENOL  Take 1,300 mg by mouth every 8 (eight) hours.     cetirizine 10 MG tablet  Commonly known  as:  ZYRTEC  Take 10 mg by mouth daily.     chlorhexidine 0.12 % solution  Commonly known as:  PERIDEX  Use as directed 15 mLs in the mouth or throat 2 (two) times daily.     dimenhyDRINATE 50 MG tablet  Commonly known as:  DRAMAMINE  Take 50 mg by mouth every 8 (eight) hours as needed for nausea.     diphenhydrAMINE 25 mg capsule  Commonly known as:  BENADRYL  Take 25-50 mg by mouth 2 (two) times daily as needed for allergies.     famotidine 10 MG chewable tablet  Commonly known as:  PEPCID AC  Chew 10 mg by mouth 2 (two) times daily as needed for heartburn.     HYDROcodone-acetaminophen 5-325 MG per tablet  Commonly known as:  NORCO/VICODIN  Take 1 tablet by mouth every 4 (four) hours as needed for moderate pain or severe pain.     methocarbamol 500 MG tablet  Commonly known as:  ROBAXIN  Take 1 tablet (500 mg total) by mouth every 6 (six) hours as needed for muscle spasms.     pravastatin 40 MG tablet  Commonly known as:  PRAVACHOL  Take 40 mg by mouth at bedtime.     pseudoephedrine 120 MG 12 hr tablet  Commonly known as:  SUDAFED  Take 120 mg by mouth every 12 (twelve) hours as needed for congestion.     rivaroxaban 10 MG Tabs tablet  Commonly known as:  XARELTO  Take 1 tablet (10 mg total) by mouth daily with breakfast.     traMADol 50 MG tablet  Commonly known as:  ULTRAM  Take 1-2 tablets (50-100 mg total) by mouth  every 6 (six) hours as needed for moderate pain.           Follow-up Information    Follow up with Gearlean Alf, MD. Schedule an appointment as soon as possible for a visit on 04/16/2014.   Specialty:  Orthopedic Surgery   Contact information:   [address] [place] [address] [phone]       Follow up with [facility].   Why:  rolling walker and 3n1 (over the commode seat)   Contact information:   [address] [place]  [address] [phone]       Follow up with [facility].   Why:  home health physical therapy and Desma Mcgregor has been requested for you   Contact information:   [place] [address] [phone]       Signed: Ardeen Jourdain, PA-C Orthopaedic Surgery 04/07/2014, 9:50 AM

## 2014-06-27 DIAGNOSIS — H2512 Age-related nuclear cataract, left eye: Secondary | ICD-10-CM | POA: Insufficient documentation

## 2014-06-27 DIAGNOSIS — H33011 Retinal detachment with single break, right eye: Secondary | ICD-10-CM | POA: Insufficient documentation

## 2014-07-01 ENCOUNTER — Other Ambulatory Visit: Payer: Self-pay

## 2015-04-22 DIAGNOSIS — Z961 Presence of intraocular lens: Secondary | ICD-10-CM | POA: Insufficient documentation

## 2015-04-22 DIAGNOSIS — H35371 Puckering of macula, right eye: Secondary | ICD-10-CM | POA: Insufficient documentation

## 2015-09-26 IMAGING — DX DG PORTABLE PELVIS
1 series · 1 of 1 positions shown · non-contrast
Comparison: None.

CLINICAL DATA: Hip replacement.

EXAM:
PORTABLE PELVIS 1-2 VIEWS

[pelvis ap]
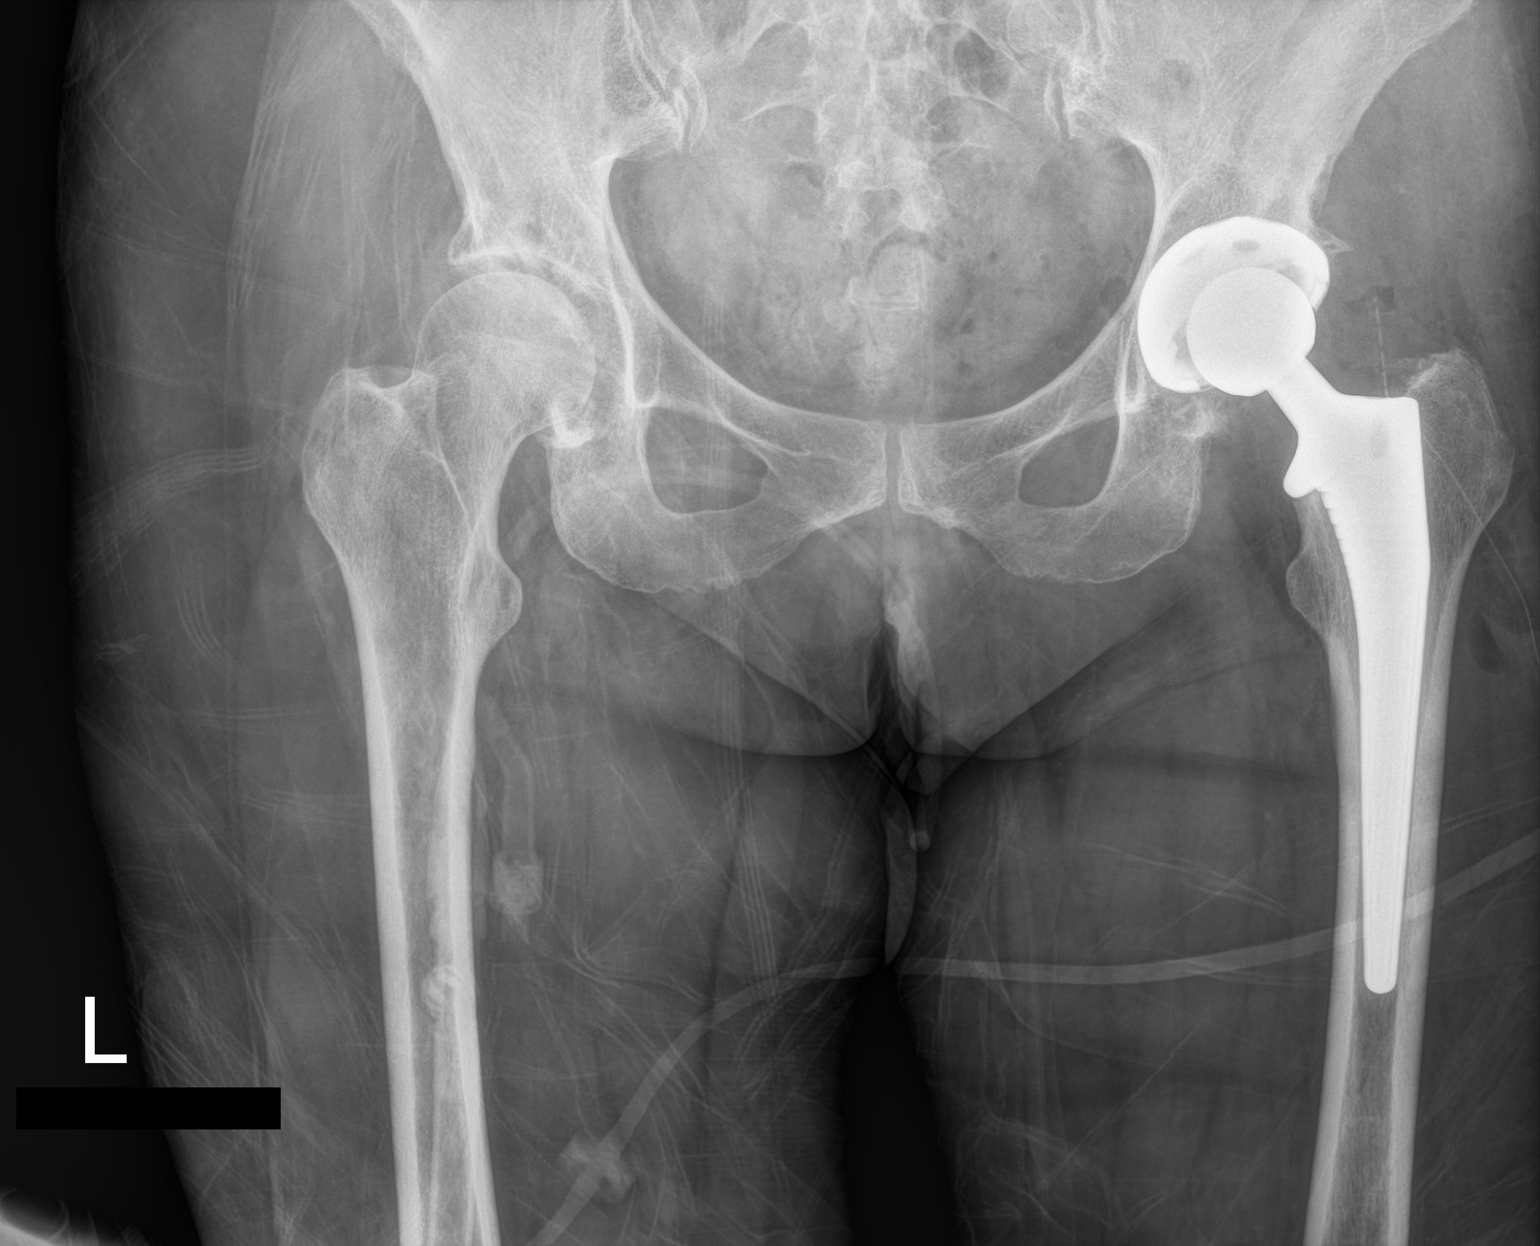

[1 of 1 positions shown; findings below may reference images not displayed]

FINDINGS: Total left hip replacement. Good anatomic alignment. Degenerative
changes noted of the right hip. No acute bony or joint abnormality
identified.
IMPRESSION: Total left hip replacement with good anatomic alignment. Hardware
intact. Degenerative changes in the right hip. No acute abnormality.

## 2015-11-12 ENCOUNTER — Encounter (HOSPITAL_COMMUNITY): Payer: Self-pay | Admitting: Emergency Medicine

## 2015-11-12 ENCOUNTER — Emergency Department (HOSPITAL_COMMUNITY)
Admission: EM | Admit: 2015-11-12 | Discharge: 2015-11-12 | Disposition: A | Discharge status: Home / Self Care | Payer: Medicare Other | Attending: Emergency Medicine | Admitting: Emergency Medicine

## 2015-11-12 DIAGNOSIS — Z96642 Presence of left artificial hip joint: Secondary | ICD-10-CM | POA: Insufficient documentation

## 2015-11-12 DIAGNOSIS — R040 Epistaxis: Secondary | ICD-10-CM | POA: Diagnosis present

## 2015-11-12 DIAGNOSIS — Z7982 Long term (current) use of aspirin: Secondary | ICD-10-CM | POA: Diagnosis not present

## 2015-11-12 DIAGNOSIS — R55 Syncope and collapse: Secondary | ICD-10-CM

## 2015-11-12 DIAGNOSIS — Z7901 Long term (current) use of anticoagulants: Secondary | ICD-10-CM | POA: Diagnosis not present

## 2015-11-12 DIAGNOSIS — Z79899 Other long term (current) drug therapy: Secondary | ICD-10-CM | POA: Diagnosis not present

## 2015-11-12 LAB — CBC WITH DIFFERENTIAL/PLATELET
BASOS ABS: 0 10*3/uL (ref 0.0–0.1)
Basophils Relative: 0 %
Eosinophils Absolute: 0.1 10*3/uL (ref 0.0–0.7)
Eosinophils Relative: 2 %
HCT: 32.7 % — ABNORMAL LOW (ref 36.0–46.0)
HEMOGLOBIN: 11.7 g/dL — AB (ref 12.0–15.0)
LYMPHS PCT: 21 %
Lymphs Abs: 1 10*3/uL (ref 0.7–4.0)
MCH: 35.6 pg — ABNORMAL HIGH (ref 26.0–34.0)
MCHC: 35.8 g/dL (ref 30.0–36.0)
MCV: 99.4 fL (ref 78.0–100.0)
Monocytes Absolute: 0.3 10*3/uL (ref 0.1–1.0)
Monocytes Relative: 7 %
NEUTROS PCT: 70 %
Neutro Abs: 3.2 10*3/uL (ref 1.7–7.7)
PLATELETS: 173 10*3/uL (ref 150–400)
RBC: 3.29 MIL/uL — AB (ref 3.87–5.11)
RDW: 12.7 % (ref 11.5–15.5)
WBC: 4.7 10*3/uL (ref 4.0–10.5)

## 2015-11-12 LAB — BASIC METABOLIC PANEL
Anion gap: 10 (ref 5–15)
BUN: 13 mg/dL (ref 6–20)
CO2: 21 mmol/L — ABNORMAL LOW (ref 22–32)
Calcium: 8.7 mg/dL — ABNORMAL LOW (ref 8.9–10.3)
Chloride: 107 mmol/L (ref 101–111)
Creatinine, Ser: 0.9 mg/dL (ref 0.44–1.00)
GFR calc Af Amer: >60 mL/min (ref >60)
Glucose, Bld: 102 mg/dL — ABNORMAL HIGH (ref 65–99)
Potassium: 3.1 mmol/L — ABNORMAL LOW (ref 3.5–5.1)
SODIUM: 138 mmol/L (ref 135–145)

## 2015-11-12 MED ORDER — OXYMETAZOLINE HCL 0.05 % NA SOLN
1.0000 | Freq: Once | NASAL | Status: AC
  Administered 2015-11-12: 1 via NASAL
  Filled 2015-11-12: qty 15

## 2015-11-12 MED ORDER — ACETAMINOPHEN 500 MG PO TABS
1000.0000 mg | ORAL_TABLET | Freq: Once | ORAL | Status: AC
  Administered 2015-11-12: 1000 mg via ORAL
  Filled 2015-11-12: qty 2

## 2015-11-12 MED ORDER — LIDOCAINE-EPINEPHRINE (PF) 2 %-1:200000 IJ SOLN: 20.0000 mL | Freq: Once | INTRAMUSCULAR | Status: DC

## 2015-11-12 NOTE — ED Provider Notes (Signed)
[facility] DEPT Provider Note   CSN: [id number] Arrival date & time: 11/12/15  0506     History   Chief Complaint Chief Complaint  Patient presents with  . Epistaxis  . Loss of Consciousness    HPI Natasha [name] is a 70 y.o. female.  The history is provided by the patient.  Epistaxis   This is a new problem. The current episode started 1 to 2 hours ago. The problem occurs constantly. The problem has been gradually improving. The bleeding has been from both nares. She has tried applying pressure for the symptoms. The treatment provided moderate relief.  Loss of Consciousness   Pertinent negatives include chest pain and fever.  Patient presents with epistaxis and also syncope Pt had spontaneous epistaxis from left nare After 20 minutes, EMS was called and they arrived  After they attempted to clean her up she had syncopal episode and was noted to be bradycardic (50s) and hypertensive No focal weakness reported  Pt reports feeling improved She had otherwise been feeling well prior to this episode   Past Medical History:  Diagnosis Date  . Arthritis    osteoarthritis L hip- job injury  . Cataract    "being monitored"-bilateral  . Chronic cystitis    no problems  . DDD (degenerative disc disease), cervical   . Diverticulosis    no problems  . GERD (gastroesophageal reflux disease)    occasional - diet controlled - no meds  . History of chicken pox   . History of positive PPD   . History of shingles   . Hyperlipidemia    under control  . Incontinence of urine    sometimes  . Internal hemorrhoids   . Lumbar spinal stenosis   . Measles   . Mumps   . Neck pain   . PONV (postoperative nausea and vomiting)    "hard time waking up with first colonoscopy-no problems since"  . Rubella   . Seasonal allergies    "year around"  . SVD (spontaneous vaginal delivery)    x 2  . Tinnitus    with daily aspirin intake    Patient Active Problem List   Diagnosis  Date Noted  . OA (osteoarthritis) of hip 04/03/2014    Past Surgical History:  Procedure Laterality Date  . COLONOSCOPY     x 3  . CYST EXCISION PERINEAL     removed as a child - general anesthesia  . HYSTEROSCOPY W/D&C N/A 04/03/2013   Procedure: DILATATION AND CURETTAGE /HYSTEROSCOPY;  Surgeon: Thurnell Lose, MD;  Location: [facility];  Service: Gynecology;  Laterality: N/A;  . NASAL SINUS SURGERY  1985  . ROTATOR CUFF REPAIR Right april 2015   with bicep attachment  . TOTAL HIP ARTHROPLASTY Left 04/03/2014   Procedure: LEFT TOTAL HIP ARTHROPLASTY ANTERIOR APPROACH;  Surgeon: Gaynelle Arabian, MD;  Location: [facility];  Service: Orthopedics;  Laterality: Left;    OB History    No data available       Home Medications    Prior to Admission medications   Medication Sig Start Date End Date Taking? Authorizing Provider  Ascorbic Acid (VITAMIN C PO) Take 1 tablet by mouth daily.   Yes Historical Provider, MD  aspirin EC 81 MG tablet Take 81 mg by mouth daily.   Yes Historical Provider, MD  pravastatin (PRAVACHOL) 40 MG tablet Take 40 mg by mouth at bedtime.   Yes Historical Provider, MD  HYDROcodone-acetaminophen (NORCO/VICODIN) 5-325 MG per tablet Take 1  tablet by mouth every 4 (four) hours as needed for moderate pain or severe pain. Patient not taking: Reported on 11/12/2015 04/05/14   Ardeen Jourdain, PA-C  methocarbamol (ROBAXIN) 500 MG tablet Take 1 tablet (500 mg total) by mouth every 6 (six) hours as needed for muscle spasms. Patient not taking: Reported on 11/12/2015 04/04/14   Ardeen Jourdain, PA-C  rivaroxaban (XARELTO) 10 MG TABS tablet Take 1 tablet (10 mg total) by mouth daily with breakfast. Patient not taking: Reported on 11/12/2015 04/04/14   Ardeen Jourdain, PA-C  traMADol (ULTRAM) 50 MG tablet Take 1-2 tablets (50-100 mg total) by mouth every 6 (six) hours as needed for moderate pain. Patient not taking: Reported on 11/12/2015 04/04/14   Ardeen Jourdain, PA-C    Family  History No family history on file.  Social History Social History  Substance Use Topics  . Smoking status: Never Smoker  . Smokeless tobacco: Never Used  . Alcohol use No     Allergies   Bee venom   Review of Systems Review of Systems  Constitutional: Negative for fever.  HENT: Positive for nosebleeds.   Respiratory: Negative for shortness of breath.   Cardiovascular: Positive for syncope. Negative for chest pain.  Neurological: Positive for syncope.  All other systems reviewed and are negative.    Physical Exam Updated Vital Signs BP 147/96 (BP Location: Left Arm)   Pulse (!) 59   Temp (!) 96.2 F (35.7 C) (Rectal)   Resp 21   Ht 5\' 2"  (1.575 m)   Wt 72.6 kg   SpO2 100%   BMI 29.26 kg/m   Physical Exam CONSTITUTIONAL: Well developed/well nourished HEAD: Normocephalic/atraumatic EYES: EOMI/PERRL ENMT: Mucous membranes moist, dried blood at each nare, no active bleeding NECK: supple no meningeal signs SPINE/BACK:entire spine nontender CV: S1/S2 noted, no murmurs/rubs/gallops noted LUNGS: Lungs are clear to auscultation bilaterally, no apparent distress ABDOMEN: soft, nontender, no rebound or guarding, bowel sounds noted throughout abdomen GU:no cva tenderness NEURO: Pt is awake/alert/appropriate, moves all extremitiesx4.  No facial droop.  No arm/leg drift.   EXTREMITIES: pulses normal/equal, full ROM SKIN: cool to touch PSYCH: no abnormalities of mood noted, alert and oriented to situation  ED Treatments / Results  Labs (all labs ordered are listed, but only abnormal results are displayed) Labs Reviewed  BASIC METABOLIC PANEL - Abnormal; Notable for the following:       Result Value   Potassium 3.1 (*)    CO2 21 (*)    Glucose, Bld 102 (*)    Calcium 8.7 (*)    All other components within normal limits  CBC WITH DIFFERENTIAL/PLATELET - Abnormal; Notable for the following:    RBC 3.29 (*)    Hemoglobin 11.7 (*)    HCT 32.7 (*)    MCH 35.6 (*)     All other components within normal limits    EKG  EKG Interpretation  Date/Time:  Wednesday November 12 2015 05:35:27 EST Ventricular Rate:  72 PR Interval:    QRS Duration: 108 QT Interval:  410 QTC Calculation: 449 R Axis:   13 Text Interpretation:  Sinus rhythm Low voltage, precordial leads Interpretation limited secondary to artifact Confirmed by Christy Gentles  MD, [place] ([id number]) on 11/12/2015 5:44:28 AM       Radiology No results found.  Procedures .Epistaxis Management Date/Time: 11/12/2015 6:45 AM Performed by: Ripley Fraise Authorized by: Ripley Fraise   Consent:    Consent obtained:  Verbal   Consent given by:  Patient  Risks discussed:  Bleeding Anesthesia (see MAR for exact dosages):    Anesthesia method:  Topical application   Topical anesthesia: lidocaine with epinephrine. Procedure details:    Treatment site:  L anterior   Repair method: anterior/posterior.   Treatment complexity:  Extensive   Treatment episode: initial   Post-procedure details:    Assessment:  Bleeding stopped   Patient tolerance of procedure:  Tolerated well, no immediate complications Comments:     Large amount of clot removed from left nare without difficulty Pt tolerated well Rhino rocket ant/posterior applied   (including critical care time)  Medications Ordered in ED Medications  oxymetazoline (AFRIN) 0.05 % nasal spray 1 spray (1 spray Each Nare Given 11/12/15 0535)     Initial Impression / Assessment and Plan / ED Course  I have reviewed the triage vital signs and the nursing notes.  Pertinent labs & imaging results that were available during my care of the patient were reviewed by me and considered in my medical decision making (see chart for details).  Clinical Course     Pt well appearing Suspicious that the epistaxis episode and duration of episode triggered her syncopal event Labs pending EKG unremarkable She is cool to touch, will give warming  blankets 7:53 AM  Pt improved Taking po  Able to ambulate No active bleeding after packing Will d/c home and advised ENT followup  As for syncope, back to baseline, ambulatory, stable for d/c home  Final Clinical Impressions(s) / ED Diagnoses   Final diagnoses:  Epistaxis  Syncope and collapse    New Prescriptions Current Discharge Medication List       Ripley Fraise, MD 11/12/15 [phone]

## 2015-11-12 NOTE — ED Triage Notes (Signed)
Pt in EMS from home, pt began having nosebleed lasting 30 min, pt then had syncopal episode when EMS arrived. States pt was initially bradycardic with HR in 40's. HR now in 60's, normotensive. Received 64ml NS en route. CBG 147

## 2015-11-12 NOTE — ED Notes (Signed)
Rhino Rocket placed by [employer] MD into L nare

## 2015-11-12 NOTE — ED Notes (Signed)
Nose bleeding again currently. EDP aware. Pt to hold nose for 15 minutes. Will continue to monitor.

## 2015-11-12 NOTE — ED Notes (Signed)
Bleeding has currently subsided. Will continue to monitor.

## 2015-11-17 DIAGNOSIS — J310 Chronic rhinitis: Secondary | ICD-10-CM | POA: Insufficient documentation

## 2015-11-17 DIAGNOSIS — R040 Epistaxis: Secondary | ICD-10-CM | POA: Insufficient documentation

## 2016-02-03 DIAGNOSIS — H25012 Cortical age-related cataract, left eye: Secondary | ICD-10-CM | POA: Diagnosis not present

## 2016-02-03 DIAGNOSIS — H25042 Posterior subcapsular polar age-related cataract, left eye: Secondary | ICD-10-CM | POA: Diagnosis not present

## 2016-02-03 DIAGNOSIS — H2512 Age-related nuclear cataract, left eye: Secondary | ICD-10-CM | POA: Diagnosis not present

## 2016-02-03 DIAGNOSIS — H18411 Arcus senilis, right eye: Secondary | ICD-10-CM | POA: Diagnosis not present

## 2016-03-23 DIAGNOSIS — Z1231 Encounter for screening mammogram for malignant neoplasm of breast: Secondary | ICD-10-CM | POA: Diagnosis not present

## 2016-03-23 DIAGNOSIS — Z803 Family history of malignant neoplasm of breast: Secondary | ICD-10-CM | POA: Diagnosis not present

## 2016-04-15 DIAGNOSIS — M810 Age-related osteoporosis without current pathological fracture: Secondary | ICD-10-CM | POA: Diagnosis not present

## 2016-04-15 DIAGNOSIS — M8589 Other specified disorders of bone density and structure, multiple sites: Secondary | ICD-10-CM | POA: Diagnosis not present

## 2016-04-15 DIAGNOSIS — M85851 Other specified disorders of bone density and structure, right thigh: Secondary | ICD-10-CM | POA: Diagnosis not present

## 2016-05-04 DIAGNOSIS — Z0000 Encounter for general adult medical examination without abnormal findings: Secondary | ICD-10-CM | POA: Diagnosis not present

## 2016-05-04 DIAGNOSIS — Z1389 Encounter for screening for other disorder: Secondary | ICD-10-CM | POA: Diagnosis not present

## 2016-05-04 DIAGNOSIS — Z6830 Body mass index (BMI) 30.0-30.9, adult: Secondary | ICD-10-CM | POA: Diagnosis not present

## 2016-05-04 DIAGNOSIS — E7800 Pure hypercholesterolemia, unspecified: Secondary | ICD-10-CM | POA: Diagnosis not present

## 2016-05-10 DIAGNOSIS — R6884 Jaw pain: Secondary | ICD-10-CM | POA: Diagnosis not present

## 2016-05-10 DIAGNOSIS — J310 Chronic rhinitis: Secondary | ICD-10-CM | POA: Diagnosis not present

## 2016-05-10 DIAGNOSIS — M2740 Unspecified cyst of jaw: Secondary | ICD-10-CM | POA: Diagnosis not present

## 2016-05-19 DIAGNOSIS — S30861A Insect bite (nonvenomous) of abdominal wall, initial encounter: Secondary | ICD-10-CM | POA: Diagnosis not present

## 2016-06-08 DIAGNOSIS — J320 Chronic maxillary sinusitis: Secondary | ICD-10-CM | POA: Diagnosis not present

## 2016-06-08 DIAGNOSIS — J338 Other polyp of sinus: Secondary | ICD-10-CM | POA: Diagnosis not present

## 2016-06-08 DIAGNOSIS — K089 Disorder of teeth and supporting structures, unspecified: Secondary | ICD-10-CM | POA: Diagnosis not present

## 2016-06-08 DIAGNOSIS — J341 Cyst and mucocele of nose and nasal sinus: Secondary | ICD-10-CM | POA: Diagnosis not present

## 2016-06-08 DIAGNOSIS — G8929 Other chronic pain: Secondary | ICD-10-CM | POA: Insufficient documentation

## 2016-06-08 DIAGNOSIS — R05 Cough: Secondary | ICD-10-CM | POA: Diagnosis not present

## 2016-06-08 DIAGNOSIS — J310 Chronic rhinitis: Secondary | ICD-10-CM | POA: Diagnosis not present

## 2016-06-21 DIAGNOSIS — Z8742 Personal history of other diseases of the female genital tract: Secondary | ICD-10-CM | POA: Diagnosis not present

## 2016-06-21 DIAGNOSIS — N952 Postmenopausal atrophic vaginitis: Secondary | ICD-10-CM | POA: Diagnosis not present

## 2016-06-21 DIAGNOSIS — D239 Other benign neoplasm of skin, unspecified: Secondary | ICD-10-CM | POA: Diagnosis not present

## 2016-06-21 DIAGNOSIS — Z01411 Encounter for gynecological examination (general) (routine) with abnormal findings: Secondary | ICD-10-CM | POA: Diagnosis not present

## 2016-06-21 DIAGNOSIS — M810 Age-related osteoporosis without current pathological fracture: Secondary | ICD-10-CM | POA: Diagnosis not present

## 2016-09-21 DIAGNOSIS — G8929 Other chronic pain: Secondary | ICD-10-CM | POA: Diagnosis not present

## 2016-09-21 DIAGNOSIS — M25512 Pain in left shoulder: Secondary | ICD-10-CM | POA: Diagnosis not present

## 2016-09-28 DIAGNOSIS — Z23 Encounter for immunization: Secondary | ICD-10-CM | POA: Diagnosis not present

## 2017-01-04 HISTORY — PX: SHOULDER SURGERY: SHX246

## 2017-02-03 DIAGNOSIS — M25512 Pain in left shoulder: Secondary | ICD-10-CM | POA: Diagnosis not present

## 2017-02-08 DIAGNOSIS — H25042 Posterior subcapsular polar age-related cataract, left eye: Secondary | ICD-10-CM | POA: Diagnosis not present

## 2017-02-08 DIAGNOSIS — H2512 Age-related nuclear cataract, left eye: Secondary | ICD-10-CM | POA: Diagnosis not present

## 2017-02-08 DIAGNOSIS — H25012 Cortical age-related cataract, left eye: Secondary | ICD-10-CM | POA: Diagnosis not present

## 2017-02-08 DIAGNOSIS — H18411 Arcus senilis, right eye: Secondary | ICD-10-CM | POA: Diagnosis not present

## 2017-02-10 DIAGNOSIS — M25512 Pain in left shoulder: Secondary | ICD-10-CM | POA: Diagnosis not present

## 2017-02-17 DIAGNOSIS — M19019 Primary osteoarthritis, unspecified shoulder: Secondary | ICD-10-CM | POA: Diagnosis not present

## 2017-02-17 DIAGNOSIS — M19012 Primary osteoarthritis, left shoulder: Secondary | ICD-10-CM | POA: Diagnosis not present

## 2017-02-17 DIAGNOSIS — M75112 Incomplete rotator cuff tear or rupture of left shoulder, not specified as traumatic: Secondary | ICD-10-CM | POA: Diagnosis not present

## 2017-02-27 DIAGNOSIS — R040 Epistaxis: Secondary | ICD-10-CM | POA: Diagnosis not present

## 2017-02-27 DIAGNOSIS — E7800 Pure hypercholesterolemia, unspecified: Secondary | ICD-10-CM | POA: Diagnosis not present

## 2017-02-27 DIAGNOSIS — Z7982 Long term (current) use of aspirin: Secondary | ICD-10-CM | POA: Diagnosis not present

## 2017-03-04 DIAGNOSIS — R040 Epistaxis: Secondary | ICD-10-CM | POA: Diagnosis not present

## 2017-03-29 DIAGNOSIS — Z1231 Encounter for screening mammogram for malignant neoplasm of breast: Secondary | ICD-10-CM | POA: Diagnosis not present

## 2017-03-29 DIAGNOSIS — Z803 Family history of malignant neoplasm of breast: Secondary | ICD-10-CM | POA: Diagnosis not present

## 2017-05-09 DIAGNOSIS — Z1389 Encounter for screening for other disorder: Secondary | ICD-10-CM | POA: Diagnosis not present

## 2017-05-09 DIAGNOSIS — E7800 Pure hypercholesterolemia, unspecified: Secondary | ICD-10-CM | POA: Diagnosis not present

## 2017-05-09 DIAGNOSIS — Z0000 Encounter for general adult medical examination without abnormal findings: Secondary | ICD-10-CM | POA: Diagnosis not present

## 2017-05-09 DIAGNOSIS — Z6830 Body mass index (BMI) 30.0-30.9, adult: Secondary | ICD-10-CM | POA: Diagnosis not present

## 2017-05-16 DIAGNOSIS — S46192A Other injury of muscle, fascia and tendon of long head of biceps, left arm, initial encounter: Secondary | ICD-10-CM | POA: Diagnosis not present

## 2017-05-16 DIAGNOSIS — Y999 Unspecified external cause status: Secondary | ICD-10-CM | POA: Diagnosis not present

## 2017-05-16 DIAGNOSIS — M659 Synovitis and tenosynovitis, unspecified: Secondary | ICD-10-CM | POA: Diagnosis not present

## 2017-05-16 DIAGNOSIS — S43432A Superior glenoid labrum lesion of left shoulder, initial encounter: Secondary | ICD-10-CM | POA: Diagnosis not present

## 2017-05-16 DIAGNOSIS — X58XXXA Exposure to other specified factors, initial encounter: Secondary | ICD-10-CM | POA: Diagnosis not present

## 2017-05-16 DIAGNOSIS — M7521 Bicipital tendinitis, right shoulder: Secondary | ICD-10-CM | POA: Diagnosis not present

## 2017-05-16 DIAGNOSIS — M75112 Incomplete rotator cuff tear or rupture of left shoulder, not specified as traumatic: Secondary | ICD-10-CM | POA: Diagnosis not present

## 2017-05-16 DIAGNOSIS — S46012A Strain of muscle(s) and tendon(s) of the rotator cuff of left shoulder, initial encounter: Secondary | ICD-10-CM | POA: Diagnosis not present

## 2017-05-16 DIAGNOSIS — G8918 Other acute postprocedural pain: Secondary | ICD-10-CM | POA: Diagnosis not present

## 2017-05-16 DIAGNOSIS — M94212 Chondromalacia, left shoulder: Secondary | ICD-10-CM | POA: Diagnosis not present

## 2017-05-24 DIAGNOSIS — M25512 Pain in left shoulder: Secondary | ICD-10-CM | POA: Diagnosis not present

## 2017-05-27 DIAGNOSIS — M25512 Pain in left shoulder: Secondary | ICD-10-CM | POA: Diagnosis not present

## 2017-06-01 DIAGNOSIS — M25512 Pain in left shoulder: Secondary | ICD-10-CM | POA: Diagnosis not present

## 2017-06-03 DIAGNOSIS — M25512 Pain in left shoulder: Secondary | ICD-10-CM | POA: Diagnosis not present

## 2017-06-07 DIAGNOSIS — M25512 Pain in left shoulder: Secondary | ICD-10-CM | POA: Diagnosis not present

## 2017-06-09 DIAGNOSIS — M25512 Pain in left shoulder: Secondary | ICD-10-CM | POA: Diagnosis not present

## 2017-06-14 DIAGNOSIS — M25512 Pain in left shoulder: Secondary | ICD-10-CM | POA: Diagnosis not present

## 2017-06-16 DIAGNOSIS — M25512 Pain in left shoulder: Secondary | ICD-10-CM | POA: Diagnosis not present

## 2017-06-21 DIAGNOSIS — M25512 Pain in left shoulder: Secondary | ICD-10-CM | POA: Diagnosis not present

## 2017-06-23 DIAGNOSIS — M25512 Pain in left shoulder: Secondary | ICD-10-CM | POA: Diagnosis not present

## 2017-06-28 DIAGNOSIS — M25512 Pain in left shoulder: Secondary | ICD-10-CM | POA: Diagnosis not present

## 2017-06-30 DIAGNOSIS — M25512 Pain in left shoulder: Secondary | ICD-10-CM | POA: Diagnosis not present

## 2017-07-04 DIAGNOSIS — M810 Age-related osteoporosis without current pathological fracture: Secondary | ICD-10-CM | POA: Diagnosis not present

## 2017-07-04 DIAGNOSIS — Z01419 Encounter for gynecological examination (general) (routine) without abnormal findings: Secondary | ICD-10-CM | POA: Diagnosis not present

## 2017-07-08 DIAGNOSIS — M25512 Pain in left shoulder: Secondary | ICD-10-CM | POA: Diagnosis not present

## 2017-07-15 DIAGNOSIS — M25512 Pain in left shoulder: Secondary | ICD-10-CM | POA: Diagnosis not present

## 2017-07-22 DIAGNOSIS — M25512 Pain in left shoulder: Secondary | ICD-10-CM | POA: Diagnosis not present

## 2017-07-29 DIAGNOSIS — M25512 Pain in left shoulder: Secondary | ICD-10-CM | POA: Diagnosis not present

## 2017-08-05 DIAGNOSIS — M25512 Pain in left shoulder: Secondary | ICD-10-CM | POA: Diagnosis not present

## 2017-08-10 DIAGNOSIS — M25512 Pain in left shoulder: Secondary | ICD-10-CM | POA: Diagnosis not present

## 2017-08-19 DIAGNOSIS — M25512 Pain in left shoulder: Secondary | ICD-10-CM | POA: Diagnosis not present

## 2017-08-26 DIAGNOSIS — M25512 Pain in left shoulder: Secondary | ICD-10-CM | POA: Diagnosis not present

## 2017-09-06 DIAGNOSIS — M25512 Pain in left shoulder: Secondary | ICD-10-CM | POA: Diagnosis not present

## 2017-09-06 DIAGNOSIS — Z9889 Other specified postprocedural states: Secondary | ICD-10-CM | POA: Diagnosis not present

## 2017-09-09 DIAGNOSIS — M25512 Pain in left shoulder: Secondary | ICD-10-CM | POA: Diagnosis not present

## 2017-09-12 DIAGNOSIS — Z23 Encounter for immunization: Secondary | ICD-10-CM | POA: Diagnosis not present

## 2017-09-19 DIAGNOSIS — M25512 Pain in left shoulder: Secondary | ICD-10-CM | POA: Diagnosis not present

## 2017-09-20 DIAGNOSIS — M25512 Pain in left shoulder: Secondary | ICD-10-CM | POA: Diagnosis not present

## 2017-10-13 DIAGNOSIS — M75112 Incomplete rotator cuff tear or rupture of left shoulder, not specified as traumatic: Secondary | ICD-10-CM | POA: Diagnosis not present

## 2017-10-14 DIAGNOSIS — L280 Lichen simplex chronicus: Secondary | ICD-10-CM | POA: Diagnosis not present

## 2017-10-14 DIAGNOSIS — L720 Epidermal cyst: Secondary | ICD-10-CM | POA: Diagnosis not present

## 2017-10-14 DIAGNOSIS — I788 Other diseases of capillaries: Secondary | ICD-10-CM | POA: Diagnosis not present

## 2017-10-14 DIAGNOSIS — L814 Other melanin hyperpigmentation: Secondary | ICD-10-CM | POA: Diagnosis not present

## 2017-11-09 ENCOUNTER — Other Ambulatory Visit: Payer: Self-pay | Admitting: Obstetrics and Gynecology

## 2017-11-09 DIAGNOSIS — M810 Age-related osteoporosis without current pathological fracture: Secondary | ICD-10-CM

## 2017-11-10 DIAGNOSIS — M75112 Incomplete rotator cuff tear or rupture of left shoulder, not specified as traumatic: Secondary | ICD-10-CM | POA: Diagnosis not present

## 2017-11-10 DIAGNOSIS — Z9889 Other specified postprocedural states: Secondary | ICD-10-CM | POA: Diagnosis not present

## 2018-01-02 DIAGNOSIS — R030 Elevated blood-pressure reading, without diagnosis of hypertension: Secondary | ICD-10-CM | POA: Diagnosis not present

## 2018-01-02 DIAGNOSIS — R8290 Unspecified abnormal findings in urine: Secondary | ICD-10-CM | POA: Diagnosis not present

## 2018-01-02 DIAGNOSIS — L729 Follicular cyst of the skin and subcutaneous tissue, unspecified: Secondary | ICD-10-CM | POA: Diagnosis not present

## 2018-01-02 DIAGNOSIS — E7800 Pure hypercholesterolemia, unspecified: Secondary | ICD-10-CM | POA: Diagnosis not present

## 2018-03-16 DIAGNOSIS — M79642 Pain in left hand: Secondary | ICD-10-CM | POA: Insufficient documentation

## 2018-03-16 DIAGNOSIS — M67442 Ganglion, left hand: Secondary | ICD-10-CM | POA: Diagnosis not present

## 2018-04-04 DIAGNOSIS — M79642 Pain in left hand: Secondary | ICD-10-CM | POA: Diagnosis not present

## 2018-05-23 DIAGNOSIS — R202 Paresthesia of skin: Secondary | ICD-10-CM | POA: Diagnosis not present

## 2018-05-23 DIAGNOSIS — M810 Age-related osteoporosis without current pathological fracture: Secondary | ICD-10-CM | POA: Diagnosis not present

## 2018-05-23 DIAGNOSIS — F419 Anxiety disorder, unspecified: Secondary | ICD-10-CM | POA: Diagnosis not present

## 2018-05-23 DIAGNOSIS — Z0000 Encounter for general adult medical examination without abnormal findings: Secondary | ICD-10-CM | POA: Diagnosis not present

## 2018-05-23 DIAGNOSIS — E7800 Pure hypercholesterolemia, unspecified: Secondary | ICD-10-CM | POA: Diagnosis not present

## 2018-05-23 DIAGNOSIS — Z1389 Encounter for screening for other disorder: Secondary | ICD-10-CM | POA: Diagnosis not present

## 2018-06-07 DIAGNOSIS — E7800 Pure hypercholesterolemia, unspecified: Secondary | ICD-10-CM | POA: Diagnosis not present

## 2018-06-21 DIAGNOSIS — R040 Epistaxis: Secondary | ICD-10-CM | POA: Diagnosis not present

## 2018-06-21 DIAGNOSIS — H9193 Unspecified hearing loss, bilateral: Secondary | ICD-10-CM | POA: Diagnosis not present

## 2018-06-29 DIAGNOSIS — H903 Sensorineural hearing loss, bilateral: Secondary | ICD-10-CM | POA: Diagnosis not present

## 2018-06-29 DIAGNOSIS — E7800 Pure hypercholesterolemia, unspecified: Secondary | ICD-10-CM | POA: Diagnosis not present

## 2018-06-29 DIAGNOSIS — E860 Dehydration: Secondary | ICD-10-CM | POA: Diagnosis not present

## 2018-08-18 DIAGNOSIS — M810 Age-related osteoporosis without current pathological fracture: Secondary | ICD-10-CM | POA: Diagnosis not present

## 2018-08-18 DIAGNOSIS — Z96642 Presence of left artificial hip joint: Secondary | ICD-10-CM | POA: Diagnosis not present

## 2018-08-18 DIAGNOSIS — Z1231 Encounter for screening mammogram for malignant neoplasm of breast: Secondary | ICD-10-CM | POA: Diagnosis not present

## 2018-08-18 DIAGNOSIS — Z803 Family history of malignant neoplasm of breast: Secondary | ICD-10-CM | POA: Diagnosis not present

## 2018-08-21 DIAGNOSIS — Z23 Encounter for immunization: Secondary | ICD-10-CM | POA: Diagnosis not present

## 2018-08-21 DIAGNOSIS — E7800 Pure hypercholesterolemia, unspecified: Secondary | ICD-10-CM | POA: Diagnosis not present

## 2018-09-08 DIAGNOSIS — R8290 Unspecified abnormal findings in urine: Secondary | ICD-10-CM | POA: Diagnosis not present

## 2018-09-08 DIAGNOSIS — N3000 Acute cystitis without hematuria: Secondary | ICD-10-CM | POA: Diagnosis not present

## 2018-09-27 DIAGNOSIS — N3000 Acute cystitis without hematuria: Secondary | ICD-10-CM | POA: Diagnosis not present

## 2018-11-21 DIAGNOSIS — H2512 Age-related nuclear cataract, left eye: Secondary | ICD-10-CM | POA: Diagnosis not present

## 2018-11-21 DIAGNOSIS — Z961 Presence of intraocular lens: Secondary | ICD-10-CM | POA: Diagnosis not present

## 2018-11-21 DIAGNOSIS — H18413 Arcus senilis, bilateral: Secondary | ICD-10-CM | POA: Diagnosis not present

## 2018-11-21 DIAGNOSIS — H25012 Cortical age-related cataract, left eye: Secondary | ICD-10-CM | POA: Diagnosis not present

## 2018-11-27 DIAGNOSIS — H2512 Age-related nuclear cataract, left eye: Secondary | ICD-10-CM | POA: Diagnosis not present

## 2018-12-15 DIAGNOSIS — R350 Frequency of micturition: Secondary | ICD-10-CM | POA: Diagnosis not present

## 2018-12-18 DIAGNOSIS — Z03818 Encounter for observation for suspected exposure to other biological agents ruled out: Secondary | ICD-10-CM | POA: Diagnosis not present

## 2018-12-18 DIAGNOSIS — Z20828 Contact with and (suspected) exposure to other viral communicable diseases: Secondary | ICD-10-CM | POA: Diagnosis not present

## 2019-01-18 ENCOUNTER — Encounter: Payer: Self-pay | Admitting: Podiatry

## 2019-01-18 ENCOUNTER — Other Ambulatory Visit: Payer: Self-pay

## 2019-01-18 ENCOUNTER — Ambulatory Visit (INDEPENDENT_AMBULATORY_CARE_PROVIDER_SITE_OTHER): Payer: Medicare Other | Admitting: Podiatry

## 2019-01-18 ENCOUNTER — Ambulatory Visit (INDEPENDENT_AMBULATORY_CARE_PROVIDER_SITE_OTHER): Payer: Medicare Other

## 2019-01-18 ENCOUNTER — Other Ambulatory Visit: Payer: Self-pay | Admitting: Podiatry

## 2019-01-18 VITALS — BP 170/107 | HR 80 | Resp 16

## 2019-01-18 DIAGNOSIS — M7742 Metatarsalgia, left foot: Secondary | ICD-10-CM

## 2019-01-18 DIAGNOSIS — M7741 Metatarsalgia, right foot: Secondary | ICD-10-CM

## 2019-01-18 DIAGNOSIS — Q828 Other specified congenital malformations of skin: Secondary | ICD-10-CM | POA: Diagnosis not present

## 2019-01-18 DIAGNOSIS — M2141 Flat foot [pes planus] (acquired), right foot: Secondary | ICD-10-CM

## 2019-01-21 NOTE — Progress Notes (Signed)
Subjective:  Patient ID: Natasha [name], female    DOB: 09-17-1945,  MRN: [id number] HPI Chief Complaint  Patient presents with  . Foot Pain    Plantar forefoot left - feels a knot when walking, callused area  . Foot Pain    Plantar/lateral bilateral - aching, hurts to walk  . Toe Pain    3rd toe left - "got a blister on my toe"  . NOTE    Wants orthotics    74 y.o. female presents with the above complaint.   ROS: Denies fever chills nausea vomiting muscle aches pains calf pain back pain chest pain shortness of breath.  Past Medical History:  Diagnosis Date  . Arthritis    osteoarthritis L hip- job injury  . Cataract    "being monitored"-bilateral  . Chronic cystitis    no problems  . DDD (degenerative disc disease), cervical   . Diverticulosis    no problems  . GERD (gastroesophageal reflux disease)    occasional - diet controlled - no meds  . History of chicken pox   . History of positive PPD   . History of shingles   . Hyperlipidemia    under control  . Incontinence of urine    sometimes  . Internal hemorrhoids   . Lumbar spinal stenosis   . Measles   . Mumps   . Neck pain   . PONV (postoperative nausea and vomiting)    "hard time waking up with first colonoscopy-no problems since"  . Rubella   . Seasonal allergies    "year around"  . SVD (spontaneous vaginal delivery)    x 2  . Tinnitus    with daily aspirin intake   Past Surgical History:  Procedure Laterality Date  . COLONOSCOPY     x 3  . CYST EXCISION PERINEAL     removed as a child - general anesthesia  . HYSTEROSCOPY WITH D & C N/A 04/03/2013   Procedure: DILATATION AND CURETTAGE /HYSTEROSCOPY;  Surgeon: Thurnell Lose, MD;  Location: [facility];  Service: Gynecology;  Laterality: N/A;  . NASAL SINUS SURGERY  1985  . ROTATOR CUFF REPAIR Right april 2015   with bicep attachment  . TOTAL HIP ARTHROPLASTY Left 04/03/2014   Procedure: LEFT TOTAL HIP ARTHROPLASTY ANTERIOR APPROACH;  Surgeon: Gaynelle Arabian, MD;  Location: [facility];  Service: Orthopedics;  Laterality: Left;    Current Outpatient Medications:  .  acetaminophen (TYLENOL) 650 MG suppository, Place 650 mg rectally every 4 (four) hours as needed., Disp: , Rfl:  .  ALPRAZolam (XANAX) 0.5 MG tablet, TAKE 1 TABLET BY MOUTH ONCE DAILY AS NEEDED FOR ANXIETY, Disp: , Rfl:  .  calcium citrate (CALCITRATE - DOSED IN MG ELEMENTAL CALCIUM) 950 (200 Ca) MG tablet, Take 200 mg of elemental calcium by mouth daily., Disp: , Rfl:  .  cetirizine (ZYRTEC) 10 MG tablet, Take 10 mg by mouth daily., Disp: , Rfl:  .  CRANBERRY PO, Take by mouth., Disp: , Rfl:  .  fluticasone (FLONASE) 50 MCG/ACT nasal spray, as needed., Disp: , Rfl:  .  magnesium oxide (MAG-OX) 400 MG tablet, Take 400 mg by mouth daily., Disp: , Rfl:  .  Multiple Vitamin (MULTIVITAMIN) capsule, Take 1 capsule by mouth daily., Disp: , Rfl:  .  POTASSIUM CHLORIDE ER PO, Take by mouth., Disp: , Rfl:  .  Ascorbic Acid (VITAMIN C PO), Take 1 tablet by mouth daily., Disp: , Rfl:  .  b complex vitamins capsule,  with Biotin 1 capsule, Disp: , Rfl:  .  Biotin 5 MG TBDP, 1 capsule, Disp: , Rfl:  .  calcium carbonate (TUMS - DOSED IN MG ELEMENTAL CALCIUM) 500 MG chewable tablet, as needed., Disp: , Rfl:  .  pravastatin (PRAVACHOL) 40 MG tablet, Take 40 mg by mouth at bedtime., Disp: , Rfl:  .  ZADITOR 0.025 % ophthalmic solution, SMARTSIG:1 Drop(s) In Eye(s) As Needed, Disp: , Rfl:   Allergies  Allergen Reactions  . Wasp Venom Protein     Other reaction(s): Edema, Other (See Comments)  . Aspirin     Other reaction(s): Other (See Comments)  . Bee Venom Swelling  . Other     Other reaction(s): Other (See Comments)  . Tetracycline     Other reaction(s): Other (See Comments)   Review of Systems Objective:   Vitals:   01/18/19 1430  BP: (!) 170/107  Pulse: 80  Resp: 16    General: Well developed, nourished, in no acute distress, alert and oriented x3   Dermatological: Skin is  warm, dry and supple bilateral. Nails x 10 are well maintained; remaining integument appears unremarkable at this time. There are no open sores, no preulcerative lesions, no rash or signs of infection present.  Vascular: Dorsalis Pedis artery and Posterior Tibial artery pedal pulses are 2/4 bilateral with immedate capillary fill time. Pedal hair growth present. No varicosities and no lower extremity edema present bilateral.   Neruologic: Grossly intact via light touch bilateral. Vibratory intact via tuning fork bilateral. Protective threshold with Semmes Wienstein monofilament intact to all pedal sites bilateral. Patellar and Achilles deep tendon reflexes 2+ bilateral. No Babinski or clonus noted bilateral.   Musculoskeletal: No gross boney pedal deformities bilateral. No pain, crepitus, or limitation noted with foot and ankle range of motion bilateral. Muscular strength 5/5 in all groups tested bilateral.  Gait: Unassisted, Nonantalgic.    Radiographs:  Radiographs demonstrate relatively osseous normalcy. Osseously mature individual mild osteopenia  Assessment & Plan:   Assessment: Metatarsalgia second metatarsophalangeal joint area bilaterally she also has metatarsalgia of the subfifth metatarsal base of the right foot. Reactive porokeratotic lesion bilateral foot. Hypertension  Plan: We discussed etiology pathology conservative versus surgical therapies. At this point I highly recommended orthotics. I also debrided reactive hyperkeratotic lesions. Recommend that she follow-up with her primary provider for evaluation and treatment of her hypertension.  At this point she decided not to get orthotics.     Ixel Boehning T. [place], [place]

## 2019-05-03 DIAGNOSIS — Z471 Aftercare following joint replacement surgery: Secondary | ICD-10-CM | POA: Diagnosis not present

## 2019-05-03 DIAGNOSIS — Z96642 Presence of left artificial hip joint: Secondary | ICD-10-CM | POA: Diagnosis not present

## 2019-05-28 ENCOUNTER — Emergency Department (HOSPITAL_COMMUNITY)
Admission: EM | Admit: 2019-05-28 | Discharge: 2019-05-28 | Disposition: A | Discharge status: Home / Self Care | Payer: Medicare Other | Attending: Emergency Medicine | Admitting: Emergency Medicine

## 2019-05-28 ENCOUNTER — Other Ambulatory Visit: Payer: Self-pay

## 2019-05-28 ENCOUNTER — Encounter (HOSPITAL_COMMUNITY): Payer: Self-pay

## 2019-05-28 DIAGNOSIS — R040 Epistaxis: Secondary | ICD-10-CM | POA: Insufficient documentation

## 2019-05-28 DIAGNOSIS — Z79899 Other long term (current) drug therapy: Secondary | ICD-10-CM | POA: Diagnosis not present

## 2019-05-28 DIAGNOSIS — Z96642 Presence of left artificial hip joint: Secondary | ICD-10-CM | POA: Insufficient documentation

## 2019-05-28 MED ORDER — OXYMETAZOLINE HCL 0.05 % NA SOLN: 1.0000 | Freq: Once | NASAL | Status: DC

## 2019-05-28 NOTE — ED Notes (Signed)
Pt verbalizes understanding of DC instructions. Pt belongings returned and is ambulatory out of ED.  

## 2019-05-28 NOTE — ED Triage Notes (Signed)
Patient c/o epistaxis from the left nostril since 1730. Patient having a small amount of bleeding at this time. Patient reports that she used Afrin x 2 doses  Prior to coming to the ED  Patient reports that she lost a large amount of blood prior to coming to the ED.Natasha [name]

## 2019-05-28 NOTE — Discharge Instructions (Addendum)
Return to ER if you have recurrent nosebleed.  Tomorrow recommend repeating the Afrin spray once in the morning.  Avoid aggressive blowing of your nose, avoid picking her nose.  If you have recurrent nosebleed, recommend applying direct pressure for at least 20 minutes, if does not stop with direct pressure, then recommend return to ER for reassessment.

## 2019-05-29 NOTE — ED Provider Notes (Signed)
[facility] DEPT Provider Note   CSN: [id number] Arrival date & time: 05/28/19  1850     History Chief Complaint  Patient presents with  . Epistaxis    Natasha [name] is a 74 y.o. female.  Presented to ER with concern for nosebleed.  Thinks that nosebleed was coming from left nostril, starting around 530.  States that she used Afrin x2.  Apply direct pressure.  States that her arrival in ER bleeding stopped.  Over the past few years has had a couple other nosebleeds similar to this.  Denies any recent injuries.  Denies any bleeding disorders, clotting disorders.  States that she recently had a annual physical exam and was told that her blood work was normal.  HPI     Past Medical History:  Diagnosis Date  . Arthritis    osteoarthritis L hip- job injury  . Cataract    "being monitored"-bilateral  . Chronic cystitis    no problems  . DDD (degenerative disc disease), cervical   . Diverticulosis    no problems  . GERD (gastroesophageal reflux disease)    occasional - diet controlled - no meds  . History of chicken pox   . History of positive PPD   . History of shingles   . Hyperlipidemia    under control  . Incontinence of urine    sometimes  . Internal hemorrhoids   . Lumbar spinal stenosis   . Measles   . Mumps   . Neck pain   . PONV (postoperative nausea and vomiting)    "hard time waking up with first colonoscopy-no problems since"  . Rubella   . Seasonal allergies    "year around"  . SVD (spontaneous vaginal delivery)    x 2  . Tinnitus    with daily aspirin intake    Patient Active Problem List   Diagnosis Date Noted  . Bilateral hearing loss 06/21/2018  . Pain of left hand 03/16/2018  . Chronic dental pain 06/08/2016  . Maxillary cyst 05/10/2016  . Chronic rhinitis 11/17/2015  . Epistaxis, recurrent 11/17/2015  . Epiretinal membrane (ERM) of right eye 04/22/2015  . Pseudophakia, right eye 04/22/2015  . Nuclear  sclerosis of left eye 06/27/2014  . Retinal detachment of right eye with single break 06/27/2014  . OA (osteoarthritis) of hip 04/03/2014    Past Surgical History:  Procedure Laterality Date  . COLONOSCOPY     x 3  . CYST EXCISION PERINEAL     removed as a child - general anesthesia  . HYSTEROSCOPY WITH D & C N/A 04/03/2013   Procedure: DILATATION AND CURETTAGE /HYSTEROSCOPY;  Surgeon: Thurnell Lose, MD;  Location: [facility];  Service: Gynecology;  Laterality: N/A;  . NASAL SINUS SURGERY  1985  . ROTATOR CUFF REPAIR Right april 2015   with bicep attachment  . TOTAL HIP ARTHROPLASTY Left 04/03/2014   Procedure: LEFT TOTAL HIP ARTHROPLASTY ANTERIOR APPROACH;  Surgeon: Gaynelle Arabian, MD;  Location: [facility];  Service: Orthopedics;  Laterality: Left;     OB History   No obstetric history on file.     Family History  Problem Relation Age of Onset  . Dementia Mother     Social History   Tobacco Use  . Smoking status: Never Smoker  . Smokeless tobacco: Never Used  Substance Use Topics  . Alcohol use: No  . Drug use: No    Home Medications Prior to Admission medications   Medication Sig Start Date  End Date Taking? Authorizing Provider  acetaminophen (TYLENOL) 650 MG suppository Place 650 mg rectally every 4 (four) hours as needed.    [provider]  ALPRAZolam (XANAX) 0.5 MG tablet TAKE 1 TABLET BY MOUTH ONCE DAILY AS NEEDED FOR ANXIETY 06/10/18   [provider]  Ascorbic Acid (VITAMIN C PO) Take 1 tablet by mouth daily.    [provider]  b complex vitamins capsule with Biotin 1 capsule    [provider]  Biotin 5 MG TBDP 1 capsule    [provider]  calcium carbonate (TUMS - DOSED IN MG ELEMENTAL CALCIUM) 500 MG chewable tablet as needed.    [provider]  calcium citrate (CALCITRATE - DOSED IN MG ELEMENTAL CALCIUM) 950 (200 Ca) MG tablet Take 200 mg of elemental calcium by mouth daily.    [provider]    cetirizine (ZYRTEC) 10 MG tablet Take 10 mg by mouth daily.    [provider]  CRANBERRY PO Take by mouth.    [provider]  fluticasone (FLONASE) 50 MCG/ACT nasal spray as needed. 11/19/14   [provider]  magnesium oxide (MAG-OX) 400 MG tablet Take 400 mg by mouth daily.    [provider]  Multiple Vitamin (MULTIVITAMIN) capsule Take 1 capsule by mouth daily.    [provider]  POTASSIUM CHLORIDE ER PO Take by mouth.    [provider]  pravastatin (PRAVACHOL) 40 MG tablet Take 40 mg by mouth at bedtime.    [provider]  ZADITOR 0.025 % ophthalmic solution SMARTSIG:1 Drop(s) In Eye(s) As Needed 11/21/18   [provider]    Allergies    Wasp venom protein, Aspirin, Bee venom, Other, and Tetracycline  Review of Systems   Review of Systems  Constitutional: Negative for chills and fever.  HENT: Positive for nosebleeds. Negative for ear pain and sore throat.   Eyes: Negative for pain and visual disturbance.  Respiratory: Negative for cough and shortness of breath.   Cardiovascular: Negative for chest pain and palpitations.  Gastrointestinal: Negative for abdominal pain and vomiting.  Genitourinary: Negative for dysuria and hematuria.  Musculoskeletal: Negative for arthralgias and back pain.  Skin: Negative for color change and rash.  Neurological: Negative for seizures and syncope.  All other systems reviewed and are negative.   Physical Exam Updated Vital Signs BP (!) 172/102   Pulse 66   Temp 97.8 F (36.6 C) (Oral)   Resp 18   Ht 5\' 2"  (1.575 m)   Wt 74.8 kg   SpO2 97%   BMI 30.18 kg/m   Physical Exam Vitals and nursing note reviewed.  Constitutional:      General: She is not in acute distress.    Appearance: She is well-developed.  HENT:     Head: Normocephalic and atraumatic.     Comments: Small dried blood noted in bilateral nares, no active bleeding appreciated, no anatomical  defect, no traumatic process noted, no blood in posterior oropharynx Eyes:     Conjunctiva/sclera: Conjunctivae normal.  Cardiovascular:     Rate and Rhythm: Normal rate.     Pulses: Normal pulses.  Pulmonary:     Effort: Pulmonary effort is normal. No respiratory distress.  Musculoskeletal:     Cervical back: Neck supple.  Skin:    General: Skin is warm and dry.     Capillary Refill: Capillary refill takes less than 2 seconds.  Neurological:     Mental Status: She is alert.  Psychiatric:        Mood and Affect: Mood normal.        Behavior: Behavior normal.     ED Results / Procedures / Treatments   Labs (all labs ordered are listed, but only abnormal results are displayed) Labs Reviewed - No data to display  EKG None  Radiology No results found.  Procedures Procedures (including critical care time)  Medications Ordered in ED Medications  oxymetazoline (AFRIN) 0.05 % nasal spray 1 spray (has no administration in time range)    ED Course  I have reviewed the triage vital signs and the nursing notes.  Pertinent labs & imaging results that were available during my care of the patient were reviewed by me and considered in my medical decision making (see chart for details).    MDM Rules/Calculators/A&P                      74 year old lady otherwise healthy presents to ER with concern for nosebleed.  Bleeding had stopped prior to arrival.  Patient was observed in ER, no recurrence of bleeding, discharged home.  Vitals stable and well appearing.     After the discussed management above, the patient was determined to be safe for discharge.  The patient was in agreement with this plan and all questions regarding their care were answered.  ED return precautions were discussed and the patient will return to the ED with any significant worsening of condition.  Final Clinical Impression(s) / ED Diagnoses Final diagnoses:  Epistaxis    Rx / DC Orders ED Discharge  Orders    None       Lucrezia Starch, MD 05/29/19 [phone]

## 2019-06-05 DIAGNOSIS — R040 Epistaxis: Secondary | ICD-10-CM | POA: Diagnosis not present

## 2019-06-15 DIAGNOSIS — H02831 Dermatochalasis of right upper eyelid: Secondary | ICD-10-CM | POA: Diagnosis not present

## 2019-06-15 DIAGNOSIS — H16223 Keratoconjunctivitis sicca, not specified as Sjogren's, bilateral: Secondary | ICD-10-CM | POA: Diagnosis not present

## 2019-06-15 DIAGNOSIS — Z961 Presence of intraocular lens: Secondary | ICD-10-CM | POA: Diagnosis not present

## 2019-06-15 DIAGNOSIS — H18413 Arcus senilis, bilateral: Secondary | ICD-10-CM | POA: Diagnosis not present

## 2019-07-30 ENCOUNTER — Ambulatory Visit (INDEPENDENT_AMBULATORY_CARE_PROVIDER_SITE_OTHER): Payer: Medicare Other | Admitting: Orthotics

## 2019-07-30 ENCOUNTER — Other Ambulatory Visit: Payer: Self-pay

## 2019-07-30 DIAGNOSIS — M7741 Metatarsalgia, right foot: Secondary | ICD-10-CM | POA: Diagnosis not present

## 2019-07-30 DIAGNOSIS — M7742 Metatarsalgia, left foot: Secondary | ICD-10-CM | POA: Diagnosis not present

## 2019-07-30 DIAGNOSIS — Q828 Other specified congenital malformations of skin: Secondary | ICD-10-CM

## 2019-07-30 NOTE — Progress Notes (Signed)
Cast today for semi-rigid device with forefoot cushioning met pad to take pressure off metatarsals; also plan on hugging arch.

## 2019-08-17 ENCOUNTER — Other Ambulatory Visit: Payer: Medicare Other | Admitting: Orthotics

## 2019-08-17 ENCOUNTER — Other Ambulatory Visit: Payer: Self-pay

## 2019-08-20 ENCOUNTER — Other Ambulatory Visit: Payer: Medicare Other | Admitting: Orthotics

## 2019-08-20 DIAGNOSIS — Z1231 Encounter for screening mammogram for malignant neoplasm of breast: Secondary | ICD-10-CM | POA: Diagnosis not present

## 2019-09-25 DIAGNOSIS — Z23 Encounter for immunization: Secondary | ICD-10-CM | POA: Diagnosis not present

## 2019-10-20 DIAGNOSIS — Z23 Encounter for immunization: Secondary | ICD-10-CM | POA: Diagnosis not present

## 2019-12-14 DIAGNOSIS — Z1389 Encounter for screening for other disorder: Secondary | ICD-10-CM | POA: Diagnosis not present

## 2019-12-14 DIAGNOSIS — R202 Paresthesia of skin: Secondary | ICD-10-CM | POA: Diagnosis not present

## 2019-12-14 DIAGNOSIS — F419 Anxiety disorder, unspecified: Secondary | ICD-10-CM | POA: Diagnosis not present

## 2019-12-14 DIAGNOSIS — Z1211 Encounter for screening for malignant neoplasm of colon: Secondary | ICD-10-CM | POA: Diagnosis not present

## 2019-12-14 DIAGNOSIS — E7800 Pure hypercholesterolemia, unspecified: Secondary | ICD-10-CM | POA: Diagnosis not present

## 2019-12-14 DIAGNOSIS — M810 Age-related osteoporosis without current pathological fracture: Secondary | ICD-10-CM | POA: Diagnosis not present

## 2019-12-14 DIAGNOSIS — Z0000 Encounter for general adult medical examination without abnormal findings: Secondary | ICD-10-CM | POA: Diagnosis not present

## 2020-01-25 DIAGNOSIS — N289 Disorder of kidney and ureter, unspecified: Secondary | ICD-10-CM | POA: Diagnosis not present

## 2020-02-18 DIAGNOSIS — N289 Disorder of kidney and ureter, unspecified: Secondary | ICD-10-CM | POA: Diagnosis not present

## 2020-03-25 DIAGNOSIS — Z1211 Encounter for screening for malignant neoplasm of colon: Secondary | ICD-10-CM | POA: Diagnosis not present

## 2020-05-19 DIAGNOSIS — D123 Benign neoplasm of transverse colon: Secondary | ICD-10-CM | POA: Diagnosis not present

## 2020-05-19 DIAGNOSIS — K5730 Diverticulosis of large intestine without perforation or abscess without bleeding: Secondary | ICD-10-CM | POA: Diagnosis not present

## 2020-05-19 DIAGNOSIS — Z1211 Encounter for screening for malignant neoplasm of colon: Secondary | ICD-10-CM | POA: Diagnosis not present

## 2020-05-21 DIAGNOSIS — D123 Benign neoplasm of transverse colon: Secondary | ICD-10-CM | POA: Diagnosis not present

## 2020-06-26 DIAGNOSIS — H02831 Dermatochalasis of right upper eyelid: Secondary | ICD-10-CM | POA: Diagnosis not present

## 2020-06-26 DIAGNOSIS — H16223 Keratoconjunctivitis sicca, not specified as Sjogren's, bilateral: Secondary | ICD-10-CM | POA: Diagnosis not present

## 2020-06-26 DIAGNOSIS — H18413 Arcus senilis, bilateral: Secondary | ICD-10-CM | POA: Diagnosis not present

## 2020-06-26 DIAGNOSIS — Z961 Presence of intraocular lens: Secondary | ICD-10-CM | POA: Diagnosis not present

## 2020-08-25 DIAGNOSIS — Z1231 Encounter for screening mammogram for malignant neoplasm of breast: Secondary | ICD-10-CM | POA: Diagnosis not present

## 2020-10-13 DIAGNOSIS — R0982 Postnasal drip: Secondary | ICD-10-CM | POA: Diagnosis not present

## 2020-11-04 ENCOUNTER — Ambulatory Visit: Payer: PRIVATE HEALTH INSURANCE | Admitting: Podiatry

## 2020-11-18 ENCOUNTER — Ambulatory Visit (INDEPENDENT_AMBULATORY_CARE_PROVIDER_SITE_OTHER): Payer: Medicare HMO | Admitting: Podiatry

## 2020-11-18 ENCOUNTER — Other Ambulatory Visit: Payer: Self-pay

## 2020-11-18 ENCOUNTER — Ambulatory Visit (INDEPENDENT_AMBULATORY_CARE_PROVIDER_SITE_OTHER): Payer: Medicare HMO

## 2020-11-18 ENCOUNTER — Encounter: Payer: Self-pay | Admitting: Podiatry

## 2020-11-18 DIAGNOSIS — M2041 Other hammer toe(s) (acquired), right foot: Secondary | ICD-10-CM

## 2020-11-18 DIAGNOSIS — R42 Dizziness and giddiness: Secondary | ICD-10-CM | POA: Insufficient documentation

## 2020-11-18 DIAGNOSIS — R2689 Other abnormalities of gait and mobility: Secondary | ICD-10-CM | POA: Diagnosis not present

## 2020-11-18 DIAGNOSIS — M2012 Hallux valgus (acquired), left foot: Secondary | ICD-10-CM

## 2020-11-18 DIAGNOSIS — M2011 Hallux valgus (acquired), right foot: Secondary | ICD-10-CM | POA: Diagnosis not present

## 2020-11-18 DIAGNOSIS — G5793 Unspecified mononeuropathy of bilateral lower limbs: Secondary | ICD-10-CM | POA: Diagnosis not present

## 2020-11-18 DIAGNOSIS — R059 Cough, unspecified: Secondary | ICD-10-CM | POA: Insufficient documentation

## 2020-11-18 DIAGNOSIS — M810 Age-related osteoporosis without current pathological fracture: Secondary | ICD-10-CM | POA: Insufficient documentation

## 2020-11-18 DIAGNOSIS — M2042 Other hammer toe(s) (acquired), left foot: Secondary | ICD-10-CM

## 2020-11-18 DIAGNOSIS — Z6830 Body mass index (BMI) 30.0-30.9, adult: Secondary | ICD-10-CM | POA: Insufficient documentation

## 2020-11-18 DIAGNOSIS — K5730 Diverticulosis of large intestine without perforation or abscess without bleeding: Secondary | ICD-10-CM | POA: Insufficient documentation

## 2020-11-18 DIAGNOSIS — N952 Postmenopausal atrophic vaginitis: Secondary | ICD-10-CM | POA: Insufficient documentation

## 2020-11-18 DIAGNOSIS — E7800 Pure hypercholesterolemia, unspecified: Secondary | ICD-10-CM | POA: Insufficient documentation

## 2020-11-18 DIAGNOSIS — R030 Elevated blood-pressure reading, without diagnosis of hypertension: Secondary | ICD-10-CM | POA: Insufficient documentation

## 2020-11-18 DIAGNOSIS — Z87898 Personal history of other specified conditions: Secondary | ICD-10-CM | POA: Insufficient documentation

## 2020-11-18 DIAGNOSIS — F419 Anxiety disorder, unspecified: Secondary | ICD-10-CM | POA: Insufficient documentation

## 2020-11-18 DIAGNOSIS — I7300 Raynaud's syndrome without gangrene: Secondary | ICD-10-CM | POA: Insufficient documentation

## 2020-11-18 DIAGNOSIS — M25552 Pain in left hip: Secondary | ICD-10-CM | POA: Insufficient documentation

## 2020-11-18 DIAGNOSIS — R209 Unspecified disturbances of skin sensation: Secondary | ICD-10-CM | POA: Insufficient documentation

## 2020-11-18 NOTE — Progress Notes (Signed)
She presents today concerned about worsening of her bunions and hammertoes states that she feels like she is starting to have some balance issues when she first gets up with her neuropathy.  But when she starts walking she does well.  She does not want the bunions and hammertoes to limit her later in life so she would like to take care of them now if they are problematic.  Objective: I have reviewed her past medical history medications allergies surgeries social history.  Pulses are palpable bilateral.  Neurologic sensorium is intact capillary fill time somewhat sluggish but I did think that she has a touch of Raynaud's.  Feet are cool to the touch.  Orthopedic evaluation does demonstrate moderate hallux valgus deformities left greater than right and symmetrical tailor's bunion deformities bilateral mild flexor contraction of the second third and fourth toes bilaterally left greater than right.  Radiographs confirm findings.  No significant osseous abnormalities other than mild osteopenia.  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  We did discuss possible need for surgical intervention.  At this point she would like to hold off on that for a little while.  We did discuss an Austin 1/5 met osteotomy and flexor contracture tenotomy's  Plan: Follow-up with me after neurological evaluation 

## 2020-11-24 ENCOUNTER — Encounter: Payer: Self-pay | Admitting: Neurology

## 2021-01-13 DIAGNOSIS — R202 Paresthesia of skin: Secondary | ICD-10-CM | POA: Diagnosis not present

## 2021-01-13 DIAGNOSIS — R002 Palpitations: Secondary | ICD-10-CM | POA: Diagnosis not present

## 2021-01-13 DIAGNOSIS — E7800 Pure hypercholesterolemia, unspecified: Secondary | ICD-10-CM | POA: Diagnosis not present

## 2021-01-13 DIAGNOSIS — M810 Age-related osteoporosis without current pathological fracture: Secondary | ICD-10-CM | POA: Diagnosis not present

## 2021-01-13 DIAGNOSIS — Z6830 Body mass index (BMI) 30.0-30.9, adult: Secondary | ICD-10-CM | POA: Diagnosis not present

## 2021-01-13 DIAGNOSIS — F419 Anxiety disorder, unspecified: Secondary | ICD-10-CM | POA: Diagnosis not present

## 2021-01-13 DIAGNOSIS — Z0000 Encounter for general adult medical examination without abnormal findings: Secondary | ICD-10-CM | POA: Diagnosis not present

## 2021-01-13 DIAGNOSIS — Z1389 Encounter for screening for other disorder: Secondary | ICD-10-CM | POA: Diagnosis not present

## 2021-01-14 ENCOUNTER — Telehealth: Payer: Self-pay

## 2021-01-14 ENCOUNTER — Other Ambulatory Visit: Payer: Self-pay | Admitting: *Deleted

## 2021-01-14 DIAGNOSIS — R002 Palpitations: Secondary | ICD-10-CM

## 2021-01-14 NOTE — Telephone Encounter (Signed)
NOTES SCANNED TO REFERRAL 

## 2021-01-22 ENCOUNTER — Ambulatory Visit (INDEPENDENT_AMBULATORY_CARE_PROVIDER_SITE_OTHER): Payer: Medicare HMO

## 2021-01-22 DIAGNOSIS — R002 Palpitations: Secondary | ICD-10-CM

## 2021-01-27 ENCOUNTER — Telehealth: Payer: Self-pay | Admitting: Podiatry

## 2021-01-27 NOTE — Telephone Encounter (Signed)
Patient has a nerve conduction appointment on 02/06/2021 and it needs a prior auth.  Please advise

## 2021-01-27 NOTE — Telephone Encounter (Signed)
Yes, neurology.

## 2021-01-27 NOTE — Telephone Encounter (Signed)
Can you check on this... I've never had to do a prior auth on a nerve conduction study

## 2021-01-27 NOTE — Telephone Encounter (Signed)
Isnt that at Neurology clinic?

## 2021-02-05 DIAGNOSIS — M810 Age-related osteoporosis without current pathological fracture: Secondary | ICD-10-CM | POA: Diagnosis not present

## 2021-02-05 DIAGNOSIS — M85851 Other specified disorders of bone density and structure, right thigh: Secondary | ICD-10-CM | POA: Diagnosis not present

## 2021-02-05 DIAGNOSIS — Z780 Asymptomatic menopausal state: Secondary | ICD-10-CM | POA: Diagnosis not present

## 2021-02-06 ENCOUNTER — Ambulatory Visit: Payer: Medicare HMO | Admitting: Neurology

## 2021-02-25 ENCOUNTER — Other Ambulatory Visit: Payer: Self-pay

## 2021-02-25 ENCOUNTER — Encounter: Payer: Self-pay | Admitting: Neurology

## 2021-02-25 ENCOUNTER — Ambulatory Visit: Payer: Medicare HMO | Admitting: Neurology

## 2021-02-25 ENCOUNTER — Other Ambulatory Visit (INDEPENDENT_AMBULATORY_CARE_PROVIDER_SITE_OTHER): Payer: Medicare HMO

## 2021-02-25 VITALS — BP 147/85 | HR 77 | Ht 62.0 in | Wt 169.0 lb

## 2021-02-25 DIAGNOSIS — M5417 Radiculopathy, lumbosacral region: Secondary | ICD-10-CM | POA: Diagnosis not present

## 2021-02-25 DIAGNOSIS — G629 Polyneuropathy, unspecified: Secondary | ICD-10-CM | POA: Diagnosis not present

## 2021-02-25 LAB — B12 AND FOLATE PANEL
Folate: >24.2 ng/mL (ref >5.9)
Vitamin B-12: 866 pg/mL (ref 211–911)

## 2021-02-25 NOTE — Progress Notes (Signed)
Natasha [name] - Initial Visit   Date: 02/25/21  Natasha [name] MRN: [id number] DOB: 15-Dec-1945   Dear Dr. Alyson [name]:  Thank you for your kind referral of Natasha [name] for consultation of feet numbness and right leg pain. Although her history is well known to you, please allow [place] to reiterate it for the purpose of our medical record. The patient was accompanied to the clinic by husband who also provides collateral information.     History of Present Illness: Natasha [name] is a 76 y.o. right-handed female with GERD, degenerative disc disease, and hyperlipidemia presenting for evaluation of bilateral feet numbness and muscle cramps.   Starting around early 2022, she began having numbness involving parts of her feet which is constant.  No exacerbating factors or alleviating factors.  She has pin prick sensation over the top of the feet.  She has imbalance.  She uses a cane as needed.  No falls over the past year.  No history of diabetes, alcohol use, or chemotherapy exposure.    Over the past month, she has right hip pain which radiates down her right leg.  Pain is sharp.  Walking and standing exacerbates her pain.  She is taking cyclobenzaprine 5mg  TID and tylenol which helps.  She has buckling of the right knee. No preceding injury.    She is retired Marine scientist.    Past Medical History:  Diagnosis Date   Arthritis    osteoarthritis L hip- job injury   Cataract    "being monitored"-bilateral   Chronic cystitis    no problems   DDD (degenerative disc disease), cervical    Diverticulosis    no problems   GERD (gastroesophageal reflux disease)    occasional - diet controlled - no meds   History of chicken pox    History of positive PPD    History of shingles    Hyperlipidemia    under control   Incontinence of urine    sometimes   Internal hemorrhoids    Lumbar spinal stenosis    Measles    Mumps    Neck pain    PONV (postoperative  nausea and vomiting)    "hard time waking up with first colonoscopy-no problems since"   Rubella    Seasonal allergies    "year around"   SVD (spontaneous vaginal delivery)    x 2   Tinnitus    with daily aspirin intake    Past Surgical History:  Procedure Laterality Date   COLONOSCOPY     x 3   CYST EXCISION PERINEAL     removed as a child - general anesthesia   HYSTEROSCOPY WITH D & C N/A 04/03/2013   Procedure: DILATATION AND CURETTAGE /HYSTEROSCOPY;  Surgeon: Thurnell Lose, MD;  Location: [facility];  Service: Gynecology;  Laterality: N/A;   NASAL SINUS SURGERY  1985   ROTATOR CUFF REPAIR Right april 2015   with bicep attachment   TOTAL HIP ARTHROPLASTY Left 04/03/2014   Procedure: LEFT TOTAL HIP ARTHROPLASTY ANTERIOR APPROACH;  Surgeon: Gaynelle Arabian, MD;  Location: [facility];  Service: Orthopedics;  Laterality: Left;     Medications:  Outpatient Encounter Medications as of 02/25/2021  Medication Sig   acetaminophen (TYLENOL) 650 MG CR tablet Take 650 mg by mouth every 8 (eight) hours as needed for pain.   Ascorbic Acid (VITAMIN C PO) Take 1 tablet by mouth daily.   b complex vitamins capsule with Biotin 1 capsule  Biotin 5 MG TBDP 1 capsule   calcium carbonate (TUMS - DOSED IN MG ELEMENTAL CALCIUM) 500 MG chewable tablet as needed.   cetirizine (ZYRTEC) 10 MG tablet Take 10 mg by mouth daily.   chlorhexidine (PERIDEX) 0.12 % solution Use as directed 15 mLs in the mouth or throat 2 (two) times daily.   Cholecalciferol (VITAMIN D3) 1.25 MG (50000 UT) CAPS Take by mouth.   COPPER PO Take by mouth.   CRANBERRY PO Take by mouth.   Flaxseed, Linseed, (FLAX SEEDS PO) Take 1,400 mg by mouth.   magnesium oxide (MAG-OX) 400 MG tablet Take 400 mg by mouth daily.   Multiple Vitamin (MULTIVITAMIN) capsule Take 1 capsule by mouth daily.   POTASSIUM CHLORIDE ER PO Take by mouth.   pravastatin (PRAVACHOL) 40 MG tablet Take 40 mg by mouth at bedtime.   Pumpkin Seed-Soy Germ (AZO BLADDER  CONTROL/GO-LESS PO) Take by mouth.   sodium fluoride (FLUORISHIELD) 1.1 % GEL dental gel Place 1 application onto teeth at bedtime.   acetaminophen (TYLENOL) 650 MG suppository Place 650 mg rectally every 4 (four) hours as needed.   ALPRAZolam (XANAX) 0.5 MG tablet TAKE 1 TABLET BY MOUTH ONCE DAILY AS NEEDED FOR ANXIETY   calcium citrate (CALCITRATE - DOSED IN MG ELEMENTAL CALCIUM) 950 (200 Ca) MG tablet Take 200 mg of elemental calcium by mouth daily.   fluticasone (FLONASE) 50 MCG/ACT nasal spray as needed.   ZADITOR 0.025 % ophthalmic solution SMARTSIG:1 Drop(s) In Eye(s) As Needed   No facility-administered encounter medications on file as of 02/25/2021.    Allergies:  Allergies  Allergen Reactions   Wasp Venom Protein     Other reaction(s): Edema, Other (See Comments)   Aspirin     Other reaction(s): Other (See Comments)   Bee Venom Swelling    Other reaction(s): Unknown   Other     Other reaction(s): Other (See Comments)   Tetracycline     Other reaction(s): Other (See Comments)    Family History: Family History  Problem Relation Age of Onset   Dementia Mother     Social History: Social History   Tobacco Use   Smoking status: Never   Smokeless tobacco: Never  Vaping Use   Vaping Use: Never used  Substance Use Topics   Alcohol use: No   Drug use: No   Social History   Social History Narrative   Right Handed    Lives in a two story home     Vital Signs:  BP (!) 147/85    Pulse 77    Ht 5\' 2"  (1.575 m)    Wt 169 lb (76.7 kg)    SpO2 100%    BMI 30.91 kg/m   Neurological Exam: MENTAL STATUS including orientation to time, place, person, recent and remote memory, attention span and concentration, language, and fund of knowledge is normal.  Speech is not dysarthric.  CRANIAL NERVES: II:  No visual field defects.     III-IV-VI: Pupils equal round and reactive to light.  Normal conjugate, extra-ocular eye movements in all directions of gaze.  No nystagmus.  No  ptosis.   V:  Normal facial sensation.    VII:  Normal facial symmetry and movements.   VIII:  Normal hearing and vestibular function.   IX-X:  Normal palatal movement.   XI:  Normal shoulder shrug and head rotation.   XII:  Normal tongue strength and range of motion, no deviation or fasciculation.  MOTOR:  No atrophy,  fasciculations or abnormal movements.  No pronator drift.   Upper Extremity:  Right  Left  Deltoid  5/5   5/5   Biceps  5/5   5/5   Triceps  5/5   5/5   Infraspinatus 5/5  5/5  Medial pectoralis 5/5  5/5  Wrist extensors  5/5   5/5   Wrist flexors  5/5   5/5   Finger extensors  5/5   5/5   Finger flexors  5/5   5/5   Dorsal interossei  5/5   5/5   Abductor pollicis  5/5   5/5   Tone (Ashworth scale)  0  0   Lower Extremity:  Right  Left  Hip flexors  5/5   5/5   Hip extensors  5/5   5/5   Adductor 5/5  5/5  Abductor 5/5  5/5  Knee flexors  5/5   5/5   Knee extensors  5/5   5/5   Dorsiflexors  5/5   5/5   Plantarflexors  5/5   5/5   Toe extensors  5/5   5/5   Toe flexors  5/5   5/5   Tone (Ashworth scale)  0  0   MSRs:  Right        Left                  brachioradialis 2+  2+  biceps 2+  2+  triceps 2+  2+  patellar 1+  1+  ankle jerk 0  0  Hoffman no  no  plantar response down  down   SENSORY:  Temperature and vibration is reduced at the feet bilaterally.  Pin prick intact throughout.  Rhomberg sign is present.  COORDINATION/GAIT: Normal finger-to- nose-finger and heel-to-shin.  Intact rapid alternating movements bilaterally.    Gait narrow based and stable. Stressed gait is intact.  Unsteady with tandem gait.     IMPRESSION: Bilateral feet paresthesias, most suggestive of idiopathic neuropathy.  No risk factors such as diabetes, alcohol, or chemotherapy exposure.  I had extensive discussion with the patient regarding the pathogenesis, etiology, management, and natural course of neuropathy. Neuropathy tends to be slowly progressive, especially if a  treatable etiology is not identified.  I would like to test for treatable causes of neuropathy. I discussed that in the vast majority of cases, despite checking for reversible causes, we are unable to find the underlying etiology and management is symptomatic.    - Check vitamin B12, folate, SPEP with IFE, copper  - NCS/EMG of the legs  2.  Right leg radicular pain  - Recommend physical therapy, she would like to wait to start this until seeing her orthopaedic provider to evaluate her right hip.  She will contact my office, if referral is needed.  - Continue cyclobenzaprine 5mg  TID  Further recommendations pending results.   Thank you for allowing me to participate in patient's care.  If I can answer any additional questions, I would be pleased to do so.    Sincerely,    Graeson Nouri K. Posey Pronto, DO

## 2021-02-25 NOTE — Patient Instructions (Signed)
Check labs  Nerve testing of the legs  ELECTROMYOGRAM AND NERVE CONDUCTION STUDIES (EMG/NCS) INSTRUCTIONS  How to Prepare The neurologist conducting the EMG will need to know if you have certain medical conditions. Tell the neurologist and other EMG lab personnel if you: Have a pacemaker or any other electrical medical device Take blood-thinning medications Have hemophilia, a blood-clotting disorder that causes prolonged bleeding Bathing Take a shower or bath shortly before your exam in order to remove oils from your skin. Don't apply lotions or creams before the exam.  What to Expect You'll likely be asked to change into a hospital gown for the procedure and lie down on an examination table. The following explanations can help you understand what will happen during the exam.  Electrodes. The neurologist or a technician places surface electrodes at various locations on your skin depending on where you're experiencing symptoms. Or the neurologist may insert needle electrodes at different sites depending on your symptoms.  Sensations. The electrodes will at times transmit a tiny electrical current that you may feel as a twinge or spasm. The needle electrode may cause discomfort or pain that usually ends shortly after the needle is removed. If you are concerned about discomfort or pain, you may want to talk to the neurologist about taking a short break during the exam.  Instructions. During the needle EMG, the neurologist will assess whether there is any spontaneous electrical activity when the muscle is at rest - activity that isn't present in healthy muscle tissue - and the degree of activity when you slightly contract the muscle.  He or she will give you instructions on resting and contracting a muscle at appropriate times. Depending on what muscles and nerves the neurologist is examining, he or she may ask you to change positions during the exam.  After your EMG You may experience some  temporary, minor bruising where the needle electrode was inserted into your muscle. This bruising should fade within several days. If it persists, contact your primary care doctor.   

## 2021-02-26 ENCOUNTER — Ambulatory Visit: Payer: Medicare HMO | Admitting: Neurology

## 2021-02-26 ENCOUNTER — Other Ambulatory Visit: Payer: Medicare HMO

## 2021-02-26 DIAGNOSIS — M5417 Radiculopathy, lumbosacral region: Secondary | ICD-10-CM

## 2021-02-26 DIAGNOSIS — G629 Polyneuropathy, unspecified: Secondary | ICD-10-CM

## 2021-02-26 LAB — TSH: TSH: 3.45 u[IU]/mL (ref 0.35–5.50)

## 2021-02-26 NOTE — Procedures (Signed)
[facility]  [address] [place], [place]  [place], [place] [address] Tel: [phone] Fax:  [phone] Test Date:  02/26/2021  Patient: Natasha [name] DOB: August 14, 1945 Physician: Narda Amber, DO  Sex: Female Height: 5\' 2"  Ref Phys: Narda Amber, DO  ID#: [id number]   Technician:    Patient Complaints: This is a 76 year old female referred for evaluation of bilateral leg paresthesias.  NCV & EMG Findings: Electrodiagnostic testing of the right lower extremity and additional studies of the left shows: Bilateral sural and superficial peroneal sensory responses are within normal limits. Bilateral peroneal and tibial motor responses are within normal limits. Bilateral tibial H reflex studies are within normal limits. There is no evidence of active or chronic motor axonal changes affecting any of the tested muscles.  Motor unit configuration and recruitment pattern is within normal limits.  Impression: This is a normal study of the lower extremities.  In particular, there is no evidence of a sensorimotor polyneuropathy or lumbosacral radiculopathy.   ___________________________ Narda Amber, DO    Nerve Conduction Studies Anti Sensory Summary Table   Stim Site NR Peak (ms) Norm Peak (ms) P-T Amp (V) Norm P-T Amp  Left Sup Peroneal Anti Sensory (Ant Lat Mall)  32C  12 cm    2.6 <4.6 4.1 >3  Right Sup Peroneal Anti Sensory (Ant Lat Mall)  32C  12 cm    2.9 <4.6 5.2 >3  Left Sural Anti Sensory (Lat Mall)  32C  Calf    3.7 <4.6 6.9 >3  Right Sural Anti Sensory (Lat Mall)  32C  Calf    3.5 <4.6 7.8 >3   Motor Summary Table   Stim Site NR Onset (ms) Norm Onset (ms) O-P Amp (mV) Norm O-P Amp Site1 Site2 Delta-0 (ms) Dist (cm) Vel (m/s) Norm Vel (m/s)  Left Peroneal Motor (Ext Dig Brev)  32C  Ankle    3.8 <6.0 6.8 >2.5 B Fib Ankle 7.3 33.0 45 >40  B Fib    11.1  6.5  Poplt B Fib 1.6 7.0 44 >40  Poplt    12.7  6.3         Right Peroneal Motor (Ext Dig Brev)  32C   Ankle    3.7 <6.0 5.3 >2.5 B Fib Ankle 7.4 32.0 43 >40  B Fib    11.1  5.0  Poplt B Fib 1.6 7.0 44 >40  Poplt    12.7  4.9         Left Tibial Motor (Abd Hall Brev)  32C  Ankle    4.4 <6.0 6.8 >4 Knee Ankle 10.1 41.0 41 >40  Knee    14.5  4.3         Right Tibial Motor (Abd Hall Brev)  32C  Ankle    6.0 <6.0 7.5 >4 Knee Ankle 9.4 41.0 44 >40  Knee    15.4  4.7          H Reflex Studies   NR H-Lat (ms) Lat Norm (ms) L-R H-Lat (ms)  Left Tibial (Gastroc)  32C     33.47 <35 0.54  Right Tibial (Gastroc)  32C     32.93 <35 0.54   EMG   Side Muscle Ins Act Fibs Psw Fasc Number Recrt Dur Dur. Amp Amp. Poly Poly. Comment  Right AntTibialis Nml Nml Nml Nml Nml Nml Nml Nml Nml Nml Nml Nml N/A  Right Gastroc Nml Nml Nml Nml Nml Nml Nml Nml Nml Nml Nml Nml N/A  Right Flex Dig Long Nml Nml Nml Nml Nml Nml Nml Nml Nml Nml Nml Nml N/A  Right RectFemoris Nml Nml Nml Nml Nml Nml Nml Nml Nml Nml Nml Nml N/A  Right GluteusMed Nml Nml Nml Nml Nml Nml Nml Nml Nml Nml Nml Nml N/A  Left AntTibialis Nml Nml Nml Nml Nml Nml Nml Nml Nml Nml Nml Nml N/A  Left Gastroc Nml Nml Nml Nml Nml Nml Nml Nml Nml Nml Nml Nml N/A  Left Flex Dig Long Nml Nml Nml Nml Nml Nml Nml Nml Nml Nml Nml Nml N/A  Left RectFemoris Nml Nml Nml Nml Nml Nml Nml Nml Nml Nml Nml Nml N/A  Left GluteusMed Nml Nml Nml Nml Nml Nml Nml Nml Nml Nml Nml Nml N/A      Waveforms:

## 2021-02-28 LAB — IMMUNOFIXATION ELECTROPHORESIS
IgG (Immunoglobin G), Serum: 768 mg/dL (ref 600–1540)
IgM, Serum: 80 mg/dL (ref 50–300)
Immunofix Electr Int: NOT DETECTED
Immunoglobulin A: 176 mg/dL (ref 70–320)

## 2021-02-28 LAB — PROTEIN ELECTROPHORESIS, SERUM
Albumin ELP: 4.3 g/dL (ref 3.8–4.8)
Alpha 1: 0.4 g/dL — ABNORMAL HIGH (ref 0.2–0.3)
Alpha 2: 0.9 g/dL (ref 0.5–0.9)
Beta 2: 0.4 g/dL (ref 0.2–0.5)
Beta Globulin: 0.4 g/dL (ref 0.4–0.6)
Gamma Globulin: 0.7 g/dL — ABNORMAL LOW (ref 0.8–1.7)
Total Protein: 7 g/dL (ref 6.1–8.1)

## 2021-02-28 LAB — COPPER, SERUM: Copper: 124 ug/dL (ref 70–175)

## 2021-03-05 DIAGNOSIS — Z96642 Presence of left artificial hip joint: Secondary | ICD-10-CM | POA: Diagnosis not present

## 2021-03-05 DIAGNOSIS — M5459 Other low back pain: Secondary | ICD-10-CM | POA: Diagnosis not present

## 2021-03-05 DIAGNOSIS — M25551 Pain in right hip: Secondary | ICD-10-CM | POA: Diagnosis not present

## 2021-03-11 DIAGNOSIS — M5416 Radiculopathy, lumbar region: Secondary | ICD-10-CM | POA: Diagnosis not present

## 2021-03-13 DIAGNOSIS — M810 Age-related osteoporosis without current pathological fracture: Secondary | ICD-10-CM | POA: Diagnosis not present

## 2021-03-20 DIAGNOSIS — M25551 Pain in right hip: Secondary | ICD-10-CM | POA: Diagnosis not present

## 2021-03-23 ENCOUNTER — Ambulatory Visit: Payer: Medicare HMO | Admitting: Neurology

## 2021-03-25 DIAGNOSIS — M25551 Pain in right hip: Secondary | ICD-10-CM | POA: Diagnosis not present

## 2021-03-27 DIAGNOSIS — I493 Ventricular premature depolarization: Secondary | ICD-10-CM | POA: Diagnosis not present

## 2021-03-27 DIAGNOSIS — I499 Cardiac arrhythmia, unspecified: Secondary | ICD-10-CM | POA: Diagnosis not present

## 2021-03-27 DIAGNOSIS — R079 Chest pain, unspecified: Secondary | ICD-10-CM | POA: Diagnosis not present

## 2021-04-01 DIAGNOSIS — M25551 Pain in right hip: Secondary | ICD-10-CM | POA: Diagnosis not present

## 2021-05-22 DIAGNOSIS — M1611 Unilateral primary osteoarthritis, right hip: Secondary | ICD-10-CM | POA: Diagnosis not present

## 2021-07-14 NOTE — H&P (Signed)
TOTAL HIP ADMISSION H&P  Patient is admitted for right total hip arthroplasty.  Subjective:  Chief Complaint: Right hip pain  HPI: Natasha [name], 76 y.o. female, has a history of pain and functional disability in the right hip due to arthritis and patient has failed non-surgical conservative treatments for greater than 12 weeks to include NSAID's and/or analgesics, use of assistive devices, and activity modification. Onset of symptoms was abrupt, starting  less than one  year ago with rapidlly worsening course since that time. The patient noted no past surgery on the right hip. Patient currently rates pain in the right hip at 8 out of 10 with activity. Patient has night pain, worsening of pain with activity and weight bearing, pain that interfers with activities of daily living, and pain with passive range of motion. Patient has evidence of  a rapidly progressive osteoarthritis of the hip with a large effusion and subchondral edema as well as large acetabular subchondral cysts  by imaging studies. This condition presents safety issues increasing the risk of falls. There is no current active infection.  Patient Active Problem List   Diagnosis Date Noted   Age-related osteoporosis without current pathological fracture 11/18/2020   Anxiety 11/18/2020   Atrophic vaginitis 11/18/2020   Body mass index (BMI) 30.0-30.9, adult 11/18/2020   Cough 11/18/2020   Diverticular disease of colon 11/18/2020   Elevated blood-pressure reading without diagnosis of hypertension 11/18/2020   History of epistaxis 11/18/2020   Pain in left hip 11/18/2020   Osteoporosis 11/18/2020   Pure hypercholesterolemia 11/18/2020   Raynaud disease 11/18/2020   Skin sensation disturbance 11/18/2020   Vertigo 11/18/2020   Bilateral hearing loss 06/21/2018   Pain of left hand 03/16/2018   Chronic dental pain 06/08/2016   Maxillary cyst 05/10/2016   Chronic rhinitis 11/17/2015   Epistaxis, recurrent 11/17/2015    Epiretinal membrane (ERM) of right eye 04/22/2015   Pseudophakia, right eye 04/22/2015   Nuclear sclerosis of left eye 06/27/2014   Retinal detachment of right eye with single break 06/27/2014   OA (osteoarthritis) of hip 04/03/2014    Past Medical History:  Diagnosis Date   Arthritis    osteoarthritis L hip- job injury   Cataract    "being monitored"-bilateral   Chronic cystitis    no problems   DDD (degenerative disc disease), cervical    Diverticulosis    no problems   GERD (gastroesophageal reflux disease)    occasional - diet controlled - no meds   History of chicken pox    History of positive PPD    History of shingles    Hyperlipidemia    under control   Incontinence of urine    sometimes   Internal hemorrhoids    Lumbar spinal stenosis    Measles    Mumps    Neck pain    PONV (postoperative nausea and vomiting)    "hard time waking up with first colonoscopy-no problems since"   Rubella    Seasonal allergies    "year around"   SVD (spontaneous vaginal delivery)    x 2   Tinnitus    with daily aspirin intake    Past Surgical History:  Procedure Laterality Date   COLONOSCOPY     x 3   CYST EXCISION PERINEAL     removed as a child - general anesthesia   HYSTEROSCOPY WITH D & C N/A 04/03/2013   Procedure: DILATATION AND CURETTAGE /HYSTEROSCOPY;  Surgeon: Thurnell Lose, MD;  Location: [facility];  Service: Gynecology;  Laterality: N/A;   NASAL SINUS SURGERY  1985   ROTATOR CUFF REPAIR Right april 2015   with bicep attachment   TOTAL HIP ARTHROPLASTY Left 04/03/2014   Procedure: LEFT TOTAL HIP ARTHROPLASTY ANTERIOR APPROACH;  Surgeon: Gaynelle Arabian, MD;  Location: [facility];  Service: Orthopedics;  Laterality: Left;    Prior to Admission medications   Medication Sig Start Date End Date Taking? Authorizing Provider  acetaminophen (TYLENOL) 650 MG CR tablet Take 650 mg by mouth every 8 (eight) hours as needed for pain.    [provider]  Ascorbic Acid  (VITAMIN C PO) Take 1 tablet by mouth daily.    [provider]  b complex vitamins capsule with Biotin 1 capsule    [provider]  Biotin 5 MG TBDP 1 capsule    [provider]  calcium carbonate (TUMS - DOSED IN MG ELEMENTAL CALCIUM) 500 MG chewable tablet as needed.    [provider]  cetirizine (ZYRTEC) 10 MG tablet Take 10 mg by mouth daily.    [provider]  chlorhexidine (PERIDEX) 0.12 % solution Use as directed 15 mLs in the mouth or throat 2 (two) times daily.    [provider]  Cholecalciferol (VITAMIN D3) 1.25 MG (50000 UT) CAPS Take by mouth.    [provider]  COPPER PO Take by mouth.    [provider]  CRANBERRY PO Take by mouth.    [provider]  Flaxseed, Linseed, (FLAX SEEDS PO) Take 1,400 mg by mouth.    [provider]  magnesium oxide (MAG-OX) 400 MG tablet Take 400 mg by mouth daily.    [provider]  Multiple Vitamin (MULTIVITAMIN) capsule Take 1 capsule by mouth daily.    [provider]  POTASSIUM CHLORIDE ER PO Take by mouth.    [provider]  pravastatin (PRAVACHOL) 40 MG tablet Take 40 mg by mouth at bedtime.    [provider]  Pumpkin Seed-Soy Germ (AZO BLADDER CONTROL/GO-LESS PO) Take by mouth.    [provider]  sodium fluoride (FLUORISHIELD) 1.1 % GEL dental gel Place 1 application onto teeth at bedtime.    [provider]    Allergies  Allergen Reactions   Wasp Venom Protein     Other reaction(s): Edema, Other (See Comments)   Aspirin     Other reaction(s): Other (See Comments)   Bee Venom Swelling    Other reaction(s): Unknown   Other     Other reaction(s): Other (See Comments)   Tetracycline     Other reaction(s): Other (See Comments)    Social History   Socioeconomic History   Marital status: Married    Spouse name: Not on file   Number of children: Not on file   Years of education: Not  on file   Highest education level: Not on file  Occupational History   Not on file  Tobacco Use   Smoking status: Never   Smokeless tobacco: Never  Vaping Use   Vaping Use: Never used  Substance and Sexual Activity   Alcohol use: No   Drug use: No   Sexual activity: Yes    Birth control/protection: Post-menopausal  Other Topics Concern   Not on file  Social History Narrative   Right Handed    Lives in a two story home    Social Determinants of Health   Financial Resource Strain: Not on file  Food Insecurity: Not on file  Transportation Needs: Not on file  Physical Activity: Not on file  Stress: Not on file  Social Connections: Not on file  Intimate Partner Violence: Not on file    Tobacco Use: Low Risk  (02/25/2021)   Patient History    Smoking Tobacco Use: Never    Smokeless Tobacco Use: Never    Passive Exposure: Not on file   Social History   Substance and Sexual Activity  Alcohol Use No    Family History  Problem Relation Age of Onset   Dementia Mother     Review of Systems  Constitutional:  Negative for chills and fever.  HENT:  Negative for congestion, sore throat and tinnitus.   Eyes:  Negative for double vision, photophobia and pain.  Respiratory:  Negative for cough, shortness of breath and wheezing.   Cardiovascular:  Negative for chest pain, palpitations and orthopnea.  Gastrointestinal:  Negative for heartburn, nausea and vomiting.  Genitourinary:  Negative for dysuria, frequency and urgency.  Musculoskeletal:  Positive for joint pain.  Neurological:  Negative for dizziness, weakness and headaches.     Objective:  Physical Exam: Well nourished and well developed.  General: Alert and oriented x3, cooperative and pleasant, no acute distress.  Head: normocephalic, atraumatic, neck supple.  Eyes: EOMI.  Musculoskeletal:  Right Hip Exam: The range of motion: Flexion to 110 degrees, Internal Rotation to 10 degrees, External Rotation to 30  degrees, and abduction to 30 degrees without discomfort. There is no tenderness over the greater trochanteric bursa.  Calves soft and nontender. Motor function intact in LE. Strength 5/5 LE bilaterally. Neuro: Distal pulses 2+. Sensation to light touch intact in LE.   Imaging Review Plain radiographs demonstrate severe degenerative joint disease of the right hip. The bone quality appears to be adequate for age and reported activity level.  Assessment/Plan:  End stage arthritis, right hip  The patient history, physical examination, clinical judgement of the provider and imaging studies are consistent with end stage degenerative joint disease of the right hip and total hip arthroplasty is deemed medically necessary. The treatment options including medical management, injection therapy, arthroscopy and arthroplasty were discussed at length. The risks and benefits of total hip arthroplasty were presented and reviewed. The risks due to aseptic loosening, infection, stiffness, dislocation/subluxation, thromboembolic complications and other imponderables were discussed. The patient acknowledged the explanation, agreed to proceed with the plan and consent was signed. Patient is being admitted for inpatient treatment for surgery, pain control, PT, OT, prophylactic antibiotics, VTE prophylaxis, progressive ambulation and ADLs and discharge planning.The patient is planning to be discharged  home .   Patient's anticipated LOS is less than 2 midnights, meeting these requirements: - Lives within 1 hour of care - Has a competent adult at home to recover with post-op recover - NO history of  - Chronic pain requiring opioids  - Diabetes  - Coronary Artery Disease  - Heart failure  - Heart attack  - Stroke  - DVT/VTE  - Cardiac arrhythmia  - Respiratory Failure/COPD  - Renal failure  - Anemia  - Advanced Liver disease  Therapy Plans: HEP Disposition: Home with husband Planned DVT Prophylaxis:  Aspirin 325 mg BID DME Needed: None PCP: Maury Dus, MD (clearance received) TXA: IV Allergies: NKDA Anesthesia Concerns: Nausea/vomiting BMI: 29.4 Last HgbA1c: Not diabetic.  [facility]: [facility] (bring to room)  Other: - Required oxycodone with left total hip 7 years ago  - Patient was instructed on what medications to stop prior to surgery. -  Follow-up visit in 2 weeks with Dr. Wynelle Link - Begin physical therapy following surgery - Pre-operative lab work as pre-surgical testing - Prescriptions will be provided in hospital at time of discharge  Theresa Duty, PA-C Orthopedic Surgery [facility] [place]

## 2021-07-23 NOTE — Patient Instructions (Signed)
DUE TO COVID-19 ONLY TWO VISITORS  (aged 76 and older)  ARE ALLOWED TO COME WITH YOU AND STAY IN THE WAITING ROOM ONLY DURING PRE OP AND PROCEDURE.   **NO VISITORS ARE ALLOWED IN THE SHORT STAY AREA OR RECOVERY ROOM!!**  IF YOU WILL BE ADMITTED INTO THE HOSPITAL YOU ARE ALLOWED ONLY FOUR SUPPORT PEOPLE DURING VISITATION HOURS ONLY (7 AM -8PM)   The support person(s) must pass our screening, gel in and out, and wear a mask at all times, including in the patient's room. Patients must also wear a mask when staff or their support person are in the room. Visitors GUEST BADGE MUST BE WORN VISIBLY  One adult visitor may remain with you overnight and MUST be in the room by 8 P.M.     Your procedure is scheduled on: 08/05/21   Report to [facility] Main Entrance    Report to admitting at : 9:30 AM   Call this number if you have problems the morning of surgery [phone]   Do not eat food :After Midnight.   After Midnight you may have the following liquids until : 9:00 AM DAY OF SURGERY  Water Black Coffee (sugar ok, NO MILK/CREAM OR CREAMERS)  Tea (sugar ok, NO MILK/CREAM OR CREAMERS) regular and decaf                             Plain Jell-O (NO RED)                                           Fruit ices (not with fruit pulp, NO RED)                                     Popsicles (NO RED)                                                                  Juice: apple, WHITE grape, WHITE cranberry Sports drinks like Gatorade (NO RED)              Drink  Ensure drink AT :9:00 AM the day of surgery.     The day of surgery:  Drink ONE (1) Pre-Surgery Clear Ensure or G2 at AM the morning of surgery. Drink in one sitting. Do not sip.  This drink was given to you during your hospital  pre-op appointment visit. Nothing else to drink after completing the  Pre-Surgery Clear Ensure or G2.          If you have questions, please contact your surgeon's office.   Oral Hygiene is also important  to reduce your risk of infection.                                    Remember - BRUSH YOUR TEETH THE MORNING OF SURGERY WITH YOUR REGULAR TOOTHPASTE   Do NOT smoke after Midnight   Take these medicines the morning of surgery with A SIP  OF WATER: cetirizine,famotidine.Use eye drops and Flonase as usual  DO NOT TAKE ANY ORAL DIABETIC MEDICATIONS DAY OF YOUR SURGERY  Bring CPAP mask and tubing day of surgery.                              You may not have any metal on your body including hair pins, jewelry, and body piercing             Do not wear make-up, lotions, powders, perfumes/cologne, or deodorant  Do not wear nail polish including gel and S&S, artificial/acrylic nails, or any other type of covering on natural nails including finger and toenails. If you have artificial nails, gel coating, etc. that needs to be removed by a nail salon please have this removed prior to surgery or surgery may need to be canceled/ delayed if the surgeon/ anesthesia feels like they are unable to be safely monitored.   Do not shave  48 hours prior to surgery.    Do not bring valuables to the hospital. [place].   Contacts, dentures or bridgework may not be worn into surgery.   Bring small overnight bag day of surgery.   DO NOT Advance. PHARMACY WILL DISPENSE MEDICATIONS LISTED ON YOUR MEDICATION LIST TO YOU DURING YOUR ADMISSION [place]!    Patients discharged on the day of surgery will not be allowed to drive home.  Someone NEEDS to stay with you for the first 24 hours after anesthesia.   Special Instructions: Bring a copy of your healthcare power of attorney and living will documents         the day of surgery if you haven't scanned them before.              Please read over the following fact sheets you were given: IF YOU HAVE QUESTIONS ABOUT YOUR PRE-OP INSTRUCTIONS PLEASE CALL [phone]     [facility]  Health - Preparing for Surgery Before surgery, you can play an important role.  Because skin is not sterile, your skin needs to be as free of germs as possible.  You can reduce the number of germs on your skin by washing with CHG (chlorahexidine gluconate) soap before surgery.  CHG is an antiseptic cleaner which kills germs and bonds with the skin to continue killing germs even after washing. Please DO NOT use if you have an allergy to CHG or antibacterial soaps.  If your skin becomes reddened/irritated stop using the CHG and inform your nurse when you arrive at Short Stay. Do not shave (including legs and underarms) for at least 48 hours prior to the first CHG shower.  You may shave your face/neck. Please follow these instructions carefully:  1.  Shower with CHG Soap the night before surgery and the  morning of Surgery.  2.  If you choose to wash your hair, wash your hair first as usual with your  normal  shampoo.  3.  After you shampoo, rinse your hair and body thoroughly to remove the  shampoo.                           4.  Use CHG as you would any other liquid soap.  You can apply chg directly  to the skin  and wash                       Gently with a scrungie or clean washcloth.  5.  Apply the CHG Soap to your body ONLY FROM THE NECK DOWN.   Do not use on face/ open                           Wound or open sores. Avoid contact with eyes, ears mouth and genitals (private parts).                       Wash face,  Genitals (private parts) with your normal soap.             6.  Wash thoroughly, paying special attention to the area where your surgery  will be performed.  7.  Thoroughly rinse your body with warm water from the neck down.  8.  DO NOT shower/wash with your normal soap after using and rinsing off  the CHG Soap.                9.  Pat yourself dry with a clean towel.            10.  Wear clean pajamas.            11.  Place clean sheets on your bed the night of your first shower and do not   sleep with pets. Day of Surgery : Do not apply any lotions/deodorants the morning of surgery.  Please wear clean clothes to the hospital/surgery center.  FAILURE TO FOLLOW THESE INSTRUCTIONS MAY RESULT IN THE CANCELLATION OF YOUR SURGERY PATIENT SIGNATURE_________________________________  NURSE SIGNATURE__________________________________  ________________________________________________________________________   Adam Phenix  An incentive spirometer is a tool that can help keep your lungs clear and active. This tool measures how well you are filling your lungs with each breath. Taking long deep breaths may help reverse or decrease the chance of developing breathing (pulmonary) problems (especially infection) following: A long period of time when you are unable to move or be active. BEFORE THE PROCEDURE  If the spirometer includes an indicator to show your best effort, your nurse or respiratory therapist will set it to a desired goal. If possible, sit up straight or lean slightly forward. Try not to slouch. Hold the incentive spirometer in an upright position. INSTRUCTIONS FOR USE  Sit on the edge of your bed if possible, or sit up as far as you can in bed or on a chair. Hold the incentive spirometer in an upright position. Breathe out normally. Place the mouthpiece in your mouth and seal your lips tightly around it. Breathe in slowly and as deeply as possible, raising the piston or the ball toward the top of the column. Hold your breath for 3-5 seconds or for as long as possible. Allow the piston or ball to fall to the bottom of the column. Remove the mouthpiece from your mouth and breathe out normally. Rest for a few seconds and repeat Steps 1 through 7 at least 10 times every 1-2 hours when you are awake. Take your time and take a few normal breaths between deep breaths. The spirometer may include an indicator to show your best effort. Use the indicator as a goal to work toward  during each repetition. After each set of 10 deep breaths, practice coughing to be sure your lungs are clear. If you  have an incision (the cut made at the time of surgery), support your incision when coughing by placing a pillow or rolled up towels firmly against it. Once you are able to get out of bed, walk around indoors and cough well. You may stop using the incentive spirometer when instructed by your caregiver.  RISKS AND COMPLICATIONS Take your time so you do not get dizzy or light-headed. If you are in pain, you may need to take or ask for pain medication before doing incentive spirometry. It is harder to take a deep breath if you are having pain. AFTER USE Rest and breathe slowly and easily. It can be helpful to keep track of a log of your progress. Your caregiver can provide you with a simple table to help with this. If you are using the spirometer at home, follow these instructions: [place] IF:  You are having difficultly using the spirometer. You have trouble using the spirometer as often as instructed. Your pain medication is not giving enough relief while using the spirometer. You develop fever of 100.5 F (38.1 C) or higher. SEEK IMMEDIATE MEDICAL CARE IF:  You cough up bloody sputum that had not been present before. You develop fever of 102 F (38.9 C) or greater. You develop worsening pain at or near the incision site. MAKE SURE YOU:  Understand these instructions. Will watch your condition. Will get help right away if you are not doing well or get worse. Document Released: 05/03/2006 Document Revised: 03/15/2011 Document Reviewed: 07/04/2006 [facility] Patient Information 2014 [place], [place].   ________________________________________________________________________

## 2021-07-27 ENCOUNTER — Encounter (HOSPITAL_COMMUNITY)
Admission: RE | Admit: 2021-07-27 | Discharge: 2021-07-27 | Disposition: A | Discharge status: Home / Self Care | Payer: Medicare HMO | Source: Ambulatory Visit | Attending: Orthopedic Surgery | Admitting: Orthopedic Surgery

## 2021-07-27 ENCOUNTER — Encounter (HOSPITAL_COMMUNITY): Payer: Self-pay

## 2021-07-27 ENCOUNTER — Other Ambulatory Visit: Payer: Self-pay

## 2021-07-27 VITALS — BP 163/90 | HR 69 | Temp 98.1°F | Ht 62.0 in | Wt 160.0 lb

## 2021-07-27 DIAGNOSIS — Z01812 Encounter for preprocedural laboratory examination: Secondary | ICD-10-CM | POA: Insufficient documentation

## 2021-07-27 DIAGNOSIS — Z01818 Encounter for other preprocedural examination: Secondary | ICD-10-CM

## 2021-07-27 HISTORY — DX: Palpitations: R00.2

## 2021-07-27 LAB — CBC
HCT: 40.5 % (ref 36.0–46.0)
Hemoglobin: 14.2 g/dL (ref 12.0–15.0)
MCH: 34.7 pg — ABNORMAL HIGH (ref 26.0–34.0)
MCHC: 35.1 g/dL (ref 30.0–36.0)
MCV: 99 fL (ref 80.0–100.0)
Platelets: 329 10*3/uL (ref 150–400)
RBC: 4.09 MIL/uL (ref 3.87–5.11)
RDW: 11.8 % (ref 11.5–15.5)
WBC: 6.6 10*3/uL (ref 4.0–10.5)
nRBC: 0 % (ref 0.0–0.2)

## 2021-07-27 LAB — SURGICAL PCR SCREEN
MRSA, PCR: NEGATIVE
Staphylococcus aureus: NEGATIVE

## 2021-07-27 NOTE — Progress Notes (Signed)
For Short Stay: [place] appointment date: Date of COVID positive in last 4 days:  Bowel Prep reminder:   For Anesthesia: PCP - Dr. Maury Dus Cardiologist -   Chest x-ray -  EKG -  Stress Test -  ECHO -  Cardiac Cath -  Pacemaker/ICD device last checked: Pacemaker orders received: Device Rep notified:  Spinal Cord Stimulator:  Sleep Study -  CPAP -   Fasting Blood Sugar -  Checks Blood Sugar _____ times a day Date and result of last Hgb A1c-  Blood Thinner Instructions: Aspirin Instructions: Last Dose:  Activity level: Can go up a flight of stairs and activities of daily living without stopping and without chest pain and/or shortness of breath   Able to exercise without chest pain and/or shortness of breath   Unable to go up a flight of stairs without chest pain and/or shortness of breath     Anesthesia review:   Patient denies shortness of breath, fever, cough and chest pain at PAT appointment   Patient verbalized understanding of instructions that were given to them at the PAT appointment. Patient was also instructed that they will need to review over the PAT instructions again at home before surgery.

## 2021-08-04 ENCOUNTER — Other Ambulatory Visit: Payer: Self-pay

## 2021-08-04 NOTE — Patient Outreach (Signed)
[facility]) Care Management  08/04/2021  Natasha [name] 01-Feb-1945 [id number]   Telephone call to patient for nurse call.  No answer.  HIPAA compliant voice message left.    Plan: RN CM will attempt again within 4 business days and send letter.  Jone Baseman, RN, MSN [facility] Care Management Care Management Coordinator Direct Line [phone] Toll Free: [phone]  Fax: [phone]

## 2021-08-05 ENCOUNTER — Other Ambulatory Visit: Payer: Self-pay

## 2021-08-05 ENCOUNTER — Encounter (HOSPITAL_COMMUNITY)
Admission: RE | Disposition: A | Discharge status: Home / Self Care | Payer: Self-pay | Source: Home / Self Care | Attending: Orthopedic Surgery

## 2021-08-05 ENCOUNTER — Observation Stay (HOSPITAL_COMMUNITY): Payer: Medicare HMO

## 2021-08-05 ENCOUNTER — Encounter (HOSPITAL_COMMUNITY): Payer: Self-pay | Admitting: Orthopedic Surgery

## 2021-08-05 ENCOUNTER — Observation Stay (HOSPITAL_COMMUNITY)
Admission: RE | Admit: 2021-08-05 | Discharge: 2021-08-07 | Disposition: A | Discharge status: Home / Self Care | Payer: Medicare HMO | Attending: Orthopedic Surgery | Admitting: Orthopedic Surgery

## 2021-08-05 ENCOUNTER — Ambulatory Visit (HOSPITAL_COMMUNITY): Payer: Medicare HMO

## 2021-08-05 ENCOUNTER — Ambulatory Visit (HOSPITAL_BASED_OUTPATIENT_CLINIC_OR_DEPARTMENT_OTHER): Payer: Medicare HMO | Admitting: Certified Registered Nurse Anesthetist

## 2021-08-05 ENCOUNTER — Ambulatory Visit (HOSPITAL_COMMUNITY): Payer: Medicare HMO | Admitting: Certified Registered Nurse Anesthetist

## 2021-08-05 DIAGNOSIS — Z96642 Presence of left artificial hip joint: Secondary | ICD-10-CM | POA: Insufficient documentation

## 2021-08-05 DIAGNOSIS — Z79899 Other long term (current) drug therapy: Secondary | ICD-10-CM | POA: Insufficient documentation

## 2021-08-05 DIAGNOSIS — M1611 Unilateral primary osteoarthritis, right hip: Principal | ICD-10-CM

## 2021-08-05 DIAGNOSIS — Z96641 Presence of right artificial hip joint: Secondary | ICD-10-CM | POA: Diagnosis not present

## 2021-08-05 DIAGNOSIS — Z471 Aftercare following joint replacement surgery: Secondary | ICD-10-CM | POA: Diagnosis not present

## 2021-08-05 HISTORY — PX: TOTAL HIP ARTHROPLASTY: SHX124

## 2021-08-05 LAB — TYPE AND SCREEN
ABO/RH(D): B POS
Antibody Screen: NEGATIVE

## 2021-08-05 SURGERY — ARTHROPLASTY, HIP, TOTAL, ANTERIOR APPROACH
Anesthesia: Monitor Anesthesia Care | Site: Hip | Laterality: Right

## 2021-08-05 MED ORDER — LACTATED RINGERS IV SOLN: INTRAVENOUS | Status: DC

## 2021-08-05 MED ORDER — ACETAMINOPHEN 325 MG PO TABS: 325.0000 mg | ORAL_TABLET | Freq: Four times a day (QID) | ORAL | Status: DC | PRN

## 2021-08-05 MED ORDER — PRAVASTATIN SODIUM 20 MG PO TABS
40.0000 mg | ORAL_TABLET | Freq: Every day | ORAL | Status: DC
Start: 2021-08-06 — End: 2021-08-07
  Administered 2021-08-06: 40 mg via ORAL
  Filled 2021-08-05: qty 2

## 2021-08-05 MED ORDER — DOCUSATE SODIUM 100 MG PO CAPS
100.0000 mg | ORAL_CAPSULE | Freq: Two times a day (BID) | ORAL | Status: DC
  Administered 2021-08-05 – 2021-08-07 (×4): 100 mg via ORAL
  Filled 2021-08-05 (×4): qty 1

## 2021-08-05 MED ORDER — ACETAMINOPHEN 500 MG PO TABS: 1000.0000 mg | ORAL_TABLET | Freq: Once | ORAL | Status: DC | PRN

## 2021-08-05 MED ORDER — DEXAMETHASONE SODIUM PHOSPHATE 10 MG/ML IJ SOLN
10.0000 mg | Freq: Once | INTRAMUSCULAR | Status: AC
  Administered 2021-08-06: 10 mg via INTRAVENOUS
  Filled 2021-08-05: qty 1

## 2021-08-05 MED ORDER — METHOCARBAMOL 1000 MG/10ML IJ SOLN: 500.0000 mg | Freq: Four times a day (QID) | INTRAVENOUS | Status: DC | PRN

## 2021-08-05 MED ORDER — ACETAMINOPHEN 160 MG/5ML PO SOLN: 1000.0000 mg | Freq: Once | ORAL | Status: DC | PRN

## 2021-08-05 MED ORDER — POLYETHYLENE GLYCOL 3350 17 G PO PACK: 17.0000 g | PACK | Freq: Every day | ORAL | Status: DC | PRN

## 2021-08-05 MED ORDER — BUPIVACAINE-EPINEPHRINE (PF) 0.25% -1:200000 IJ SOLN
INTRAMUSCULAR | Status: DC | PRN
  Administered 2021-08-05: 30 mL via PERINEURAL

## 2021-08-05 MED ORDER — CEFAZOLIN SODIUM-DEXTROSE 2-4 GM/100ML-% IV SOLN
2.0000 g | INTRAVENOUS | Status: AC
  Administered 2021-08-05: 2 g via INTRAVENOUS
  Filled 2021-08-05: qty 100

## 2021-08-05 MED ORDER — ONDANSETRON HCL 4 MG/2ML IJ SOLN
INTRAMUSCULAR | Status: DC | PRN
  Administered 2021-08-05: 4 mg via INTRAVENOUS

## 2021-08-05 MED ORDER — DEXAMETHASONE SODIUM PHOSPHATE 10 MG/ML IJ SOLN
INTRAMUSCULAR | Status: AC
  Filled 2021-08-05: qty 1

## 2021-08-05 MED ORDER — POTASSIUM GLUCONATE 595 (99 K) MG PO TABS: 595.0000 mg | ORAL_TABLET | Freq: Every day | ORAL | Status: DC

## 2021-08-05 MED ORDER — BUPIVACAINE-EPINEPHRINE (PF) 0.25% -1:200000 IJ SOLN
INTRAMUSCULAR | Status: AC
  Filled 2021-08-05: qty 30

## 2021-08-05 MED ORDER — TRAMADOL HCL 50 MG PO TABS
50.0000 mg | ORAL_TABLET | Freq: Four times a day (QID) | ORAL | Status: DC | PRN
  Administered 2021-08-05: 50 mg via ORAL
  Administered 2021-08-06 – 2021-08-07 (×4): 100 mg via ORAL
  Filled 2021-08-05 (×2): qty 2
  Filled 2021-08-05: qty 1
  Filled 2021-08-05 (×2): qty 2

## 2021-08-05 MED ORDER — FENTANYL CITRATE (PF) 100 MCG/2ML IJ SOLN
INTRAMUSCULAR | Status: AC
  Filled 2021-08-05: qty 2

## 2021-08-05 MED ORDER — FENTANYL CITRATE (PF) 100 MCG/2ML IJ SOLN
INTRAMUSCULAR | Status: DC | PRN
Start: 2021-08-05 — End: 2021-08-05
  Administered 2021-08-05: 50 ug via INTRAVENOUS

## 2021-08-05 MED ORDER — OXYCODONE HCL 5 MG PO TABS: 5.0000 mg | ORAL_TABLET | Freq: Once | ORAL | Status: DC | PRN

## 2021-08-05 MED ORDER — CEFAZOLIN SODIUM-DEXTROSE 2-4 GM/100ML-% IV SOLN
2.0000 g | Freq: Four times a day (QID) | INTRAVENOUS | Status: AC
  Administered 2021-08-05 – 2021-08-06 (×2): 2 g via INTRAVENOUS
  Filled 2021-08-05 (×2): qty 100

## 2021-08-05 MED ORDER — ONDANSETRON HCL 4 MG PO TABS
4.0000 mg | ORAL_TABLET | Freq: Four times a day (QID) | ORAL | Status: DC | PRN
  Administered 2021-08-06 (×2): 4 mg via ORAL
  Filled 2021-08-05 (×2): qty 1

## 2021-08-05 MED ORDER — BISACODYL 10 MG RE SUPP: 10.0000 mg | Freq: Every day | RECTAL | Status: DC | PRN

## 2021-08-05 MED ORDER — POVIDONE-IODINE 10 % EX SWAB: Freq: Once | CUTANEOUS | Status: AC

## 2021-08-05 MED ORDER — MORPHINE SULFATE (PF) 2 MG/ML IV SOLN: 0.5000 mg | INTRAVENOUS | Status: DC | PRN

## 2021-08-05 MED ORDER — ACETAMINOPHEN 10 MG/ML IV SOLN
1000.0000 mg | Freq: Once | INTRAVENOUS | Status: DC | PRN
Start: 2021-08-05 — End: 2021-08-05

## 2021-08-05 MED ORDER — PHENOL 1.4 % MT LIQD: 1.0000 | OROMUCOSAL | Status: DC | PRN

## 2021-08-05 MED ORDER — SODIUM CHLORIDE 0.9 % IV SOLN: INTRAVENOUS | Status: DC

## 2021-08-05 MED ORDER — PROPOFOL 10 MG/ML IV BOLUS
INTRAVENOUS | Status: DC | PRN
  Administered 2021-08-05 (×6): 10 mg via INTRAVENOUS

## 2021-08-05 MED ORDER — BUPIVACAINE IN DEXTROSE 0.75-8.25 % IT SOLN
INTRATHECAL | Status: DC | PRN
  Administered 2021-08-05: 1.7 mL via INTRATHECAL

## 2021-08-05 MED ORDER — PROPOFOL 500 MG/50ML IV EMUL
INTRAVENOUS | Status: DC | PRN
  Administered 2021-08-05: 50 ug/kg/min via INTRAVENOUS

## 2021-08-05 MED ORDER — FAMOTIDINE 20 MG PO TABS
20.0000 mg | ORAL_TABLET | Freq: Every day | ORAL | Status: DC | PRN
  Administered 2021-08-06 – 2021-08-07 (×2): 20 mg via ORAL
  Filled 2021-08-05 (×2): qty 1

## 2021-08-05 MED ORDER — 0.9 % SODIUM CHLORIDE (POUR BTL) OPTIME
TOPICAL | Status: DC | PRN
  Administered 2021-08-05: 1000 mL

## 2021-08-05 MED ORDER — PHENYLEPHRINE HCL-NACL 20-0.9 MG/250ML-% IV SOLN
INTRAVENOUS | Status: DC | PRN
  Administered 2021-08-05: 5 ug/min via INTRAVENOUS

## 2021-08-05 MED ORDER — TRANEXAMIC ACID-NACL 1000-0.7 MG/100ML-% IV SOLN
1000.0000 mg | INTRAVENOUS | Status: AC
  Administered 2021-08-05: 1000 mg via INTRAVENOUS
  Filled 2021-08-05: qty 100

## 2021-08-05 MED ORDER — METOCLOPRAMIDE HCL 5 MG PO TABS: 5.0000 mg | ORAL_TABLET | Freq: Three times a day (TID) | ORAL | Status: DC | PRN

## 2021-08-05 MED ORDER — OXYCODONE HCL 5 MG/5ML PO SOLN: 5.0000 mg | Freq: Once | ORAL | Status: DC | PRN

## 2021-08-05 MED ORDER — ORAL CARE MOUTH RINSE: 15.0000 mL | Freq: Once | OROMUCOSAL | Status: AC

## 2021-08-05 MED ORDER — ASPIRIN 325 MG PO TBEC
325.0000 mg | DELAYED_RELEASE_TABLET | Freq: Two times a day (BID) | ORAL | Status: DC
  Administered 2021-08-06 – 2021-08-07 (×3): 325 mg via ORAL
  Filled 2021-08-05 (×3): qty 1

## 2021-08-05 MED ORDER — ONDANSETRON HCL 4 MG/2ML IJ SOLN
4.0000 mg | Freq: Four times a day (QID) | INTRAMUSCULAR | Status: DC | PRN
  Administered 2021-08-05: 4 mg via INTRAVENOUS
  Filled 2021-08-05: qty 2

## 2021-08-05 MED ORDER — FENTANYL CITRATE PF 50 MCG/ML IJ SOSY: 25.0000 ug | PREFILLED_SYRINGE | INTRAMUSCULAR | Status: DC | PRN

## 2021-08-05 MED ORDER — ACETAMINOPHEN 10 MG/ML IV SOLN
1000.0000 mg | Freq: Once | INTRAVENOUS | Status: DC
  Filled 2021-08-05: qty 100

## 2021-08-05 MED ORDER — CHLORHEXIDINE GLUCONATE 0.12 % MT SOLN
15.0000 mL | Freq: Once | OROMUCOSAL | Status: AC
  Administered 2021-08-05: 15 mL via OROMUCOSAL

## 2021-08-05 MED ORDER — PROPOFOL 10 MG/ML IV BOLUS
INTRAVENOUS | Status: AC
  Filled 2021-08-05: qty 20

## 2021-08-05 MED ORDER — PROPOFOL 1000 MG/100ML IV EMUL
INTRAVENOUS | Status: AC
  Filled 2021-08-05: qty 100

## 2021-08-05 MED ORDER — DEXAMETHASONE SODIUM PHOSPHATE 10 MG/ML IJ SOLN
8.0000 mg | Freq: Once | INTRAMUSCULAR | Status: AC
  Administered 2021-08-05: 8 mg via INTRAVENOUS

## 2021-08-05 MED ORDER — OXYCODONE HCL 5 MG PO TABS
5.0000 mg | ORAL_TABLET | ORAL | Status: DC | PRN
  Administered 2021-08-05: 10 mg via ORAL
  Filled 2021-08-05: qty 2

## 2021-08-05 MED ORDER — METHOCARBAMOL 500 MG PO TABS
500.0000 mg | ORAL_TABLET | Freq: Four times a day (QID) | ORAL | Status: DC | PRN
  Administered 2021-08-05 – 2021-08-07 (×4): 500 mg via ORAL
  Filled 2021-08-05 (×4): qty 1

## 2021-08-05 MED ORDER — ONDANSETRON HCL 4 MG/2ML IJ SOLN
INTRAMUSCULAR | Status: AC
Start: 2021-08-05 — End: ?
  Filled 2021-08-05: qty 2

## 2021-08-05 MED ORDER — FLUTICASONE PROPIONATE 50 MCG/ACT NA SUSP
2.0000 | Freq: Every evening | NASAL | Status: DC | PRN
Start: 2021-08-05 — End: 2021-08-07

## 2021-08-05 MED ORDER — MENTHOL 3 MG MT LOZG
1.0000 | LOZENGE | OROMUCOSAL | Status: DC | PRN
Start: 2021-08-05 — End: 2021-08-07

## 2021-08-05 MED ORDER — METOCLOPRAMIDE HCL 5 MG/ML IJ SOLN
5.0000 mg | Freq: Three times a day (TID) | INTRAMUSCULAR | Status: DC | PRN
  Administered 2021-08-06: 10 mg via INTRAVENOUS
  Filled 2021-08-05: qty 2

## 2021-08-05 SURGICAL SUPPLY — 47 items
BAG COUNTER SPONGE SURGICOUNT (BAG) IMPLANT
BAG DECANTER FOR FLEXI CONT (MISCELLANEOUS) IMPLANT
BAG SPEC THK2 15X12 ZIP CLS (MISCELLANEOUS)
BAG SPNG CNTER NS LX DISP (BAG)
BAG ZIPLOCK 12X15 (MISCELLANEOUS) IMPLANT
BLADE SAG 18X100X1.27 (BLADE) ×2 IMPLANT
COVER PERINEAL POST (MISCELLANEOUS) ×2 IMPLANT
COVER SURGICAL LIGHT HANDLE (MISCELLANEOUS) ×2 IMPLANT
CUP ACET PINNACLE SECTR 50MM (Hips) IMPLANT
DRAPE FOOT SWITCH (DRAPES) ×2 IMPLANT
DRAPE STERI IOBAN 125X83 (DRAPES) ×2 IMPLANT
DRAPE U-SHAPE 47X51 STRL (DRAPES) ×4 IMPLANT
DRSG AQUACEL AG ADV 3.5X10 (GAUZE/BANDAGES/DRESSINGS) ×2 IMPLANT
DURAPREP 26ML APPLICATOR (WOUND CARE) ×2 IMPLANT
ELECT REM PT RETURN 15FT ADLT (MISCELLANEOUS) ×2 IMPLANT
GLOVE BIO SURGEON STRL SZ 6.5 (GLOVE) IMPLANT
GLOVE BIO SURGEON STRL SZ7.5 (GLOVE) IMPLANT
GLOVE BIO SURGEON STRL SZ8 (GLOVE) ×2 IMPLANT
GLOVE BIOGEL PI IND STRL 6.5 (GLOVE) IMPLANT
GLOVE BIOGEL PI IND STRL 7.0 (GLOVE) IMPLANT
GLOVE BIOGEL PI IND STRL 8 (GLOVE) ×1 IMPLANT
GLOVE BIOGEL PI INDICATOR 6.5 (GLOVE)
GLOVE BIOGEL PI INDICATOR 7.0 (GLOVE)
GLOVE BIOGEL PI INDICATOR 8 (GLOVE) ×1
GOWN STRL REUS W/ TWL LRG LVL3 (GOWN DISPOSABLE) ×1 IMPLANT
GOWN STRL REUS W/ TWL XL LVL3 (GOWN DISPOSABLE) IMPLANT
GOWN STRL REUS W/TWL LRG LVL3 (GOWN DISPOSABLE) ×2
GOWN STRL REUS W/TWL XL LVL3 (GOWN DISPOSABLE)
HEAD FEM STD 32X+1 STRL (Hips) ×1 IMPLANT
HOLDER FOLEY CATH W/STRAP (MISCELLANEOUS) ×2 IMPLANT
KIT TURNOVER KIT A (KITS) IMPLANT
LINER MARATHON 32 50 (Hips) ×1 IMPLANT
MANIFOLD NEPTUNE II (INSTRUMENTS) ×2 IMPLANT
PACK ANTERIOR HIP CUSTOM (KITS) ×2 IMPLANT
PENCIL SMOKE EVACUATOR COATED (MISCELLANEOUS) ×2 IMPLANT
PINNACLE SECTOR CUP 50MM (Hips) ×2 IMPLANT
SPIKE FLUID TRANSFER (MISCELLANEOUS) ×2 IMPLANT
STEM FEMORAL SZ5 HIGH ACTIS (Stem) ×1 IMPLANT
STRIP CLOSURE SKIN 1/2X4 (GAUZE/BANDAGES/DRESSINGS) ×2 IMPLANT
SUT ETHIBOND NAB CT1 #1 30IN (SUTURE) ×2 IMPLANT
SUT MNCRL AB 4-0 PS2 18 (SUTURE) ×2 IMPLANT
SUT STRATAFIX 0 PDS 27 VIOLET (SUTURE) ×2
SUT VIC AB 2-0 CT1 27 (SUTURE) ×4
SUT VIC AB 2-0 CT1 TAPERPNT 27 (SUTURE) ×2 IMPLANT
SUTURE STRATFX 0 PDS 27 VIOLET (SUTURE) ×1 IMPLANT
TRAY FOLEY MTR SLVR 16FR STAT (SET/KITS/TRAYS/PACK) ×2 IMPLANT
TUBE SUCTION HIGH CAP CLEAR NV (SUCTIONS) ×2 IMPLANT

## 2021-08-05 NOTE — Transfer of Care (Signed)
Immediate Anesthesia Transfer of Care Note  Patient: Natasha [name]  Procedure(s) Performed: TOTAL HIP ARTHROPLASTY ANTERIOR APPROACH (Right: Hip)  Patient Location: PACU  Anesthesia Type:Spinal  Level of Consciousness: awake, alert  and oriented  Airway & Oxygen Therapy: Patient Spontanous Breathing and Patient connected to face mask oxygen  Post-op Assessment: Report given to RN and Post -op Vital signs reviewed and stable  Post vital signs: Reviewed and stable  Last Vitals:  Vitals Value Taken Time  BP 107/65 08/05/21 1331  Temp    Pulse 64 08/05/21 1332  Resp 17 08/05/21 1332  SpO2 100 % 08/05/21 1332  Vitals shown include unvalidated device data.  Last Pain:  Vitals:   08/05/21 1051  TempSrc:   PainSc: 0-No pain         Complications: No notable events documented.

## 2021-08-05 NOTE — Op Note (Signed)
OPERATIVE REPORT- TOTAL HIP ARTHROPLASTY   PREOPERATIVE DIAGNOSIS: Osteoarthritis of the Right hip.   POSTOPERATIVE DIAGNOSIS: Osteoarthritis of the Right  hip.   PROCEDURE: Right total hip arthroplasty, anterior approach.   SURGEON: Gaynelle Arabian, MD   ASSISTANT: Jaynie Bream, PA-C  ANESTHESIA:  Spinal  ESTIMATED BLOOD LOSS:-200 mL    DRAINS: None  COMPLICATIONS: None   CONDITION: PACU - hemodynamically stable.   BRIEF CLINICAL NOTE: Natasha [name] is a 76 y.o. female who has advanced end-  stage arthritis of their Right  hip with progressively worsening pain and  dysfunction.The patient has failed nonoperative management and presents for  total hip arthroplasty.   PROCEDURE IN DETAIL: After successful administration of spinal  anesthetic, the traction boots for the [facility] bed were placed on both  feet and the patient was placed onto the [facility] bed, boots placed into the leg  holders. The Right hip was then isolated from the perineum with plastic  drapes and prepped and draped in the usual sterile fashion. ASIS and  greater trochanter were marked and a oblique incision was made, starting  at about 1 cm lateral and 2 cm distal to the ASIS and coursing towards  the anterior cortex of the femur. The skin was cut with a 10 blade  through subcutaneous tissue to the level of the fascia overlying the  tensor fascia lata muscle. The fascia was then incised in line with the  incision at the junction of the anterior third and posterior 2/3rd. The  muscle was teased off the fascia and then the interval between the TFL  and the rectus was developed. The Hohmann retractor was then placed at  the top of the femoral neck over the capsule. The vessels overlying the  capsule were cauterized and the fat on top of the capsule was removed.  A Hohmann retractor was then placed anterior underneath the rectus  femoris to give exposure to the entire anterior capsule. A T-shaped   capsulotomy was performed. The edges were tagged and the femoral head  was identified.       Osteophytes are removed off the superior acetabulum.  The femoral neck was then cut in situ with an oscillating saw. Traction  was then applied to the left lower extremity utilizing the [facility]  traction. The femoral head was then removed. Retractors were placed  around the acetabulum and then circumferential removal of the labrum was  performed. Osteophytes were also removed. Reaming starts at 47 mm to  medialize and  Increased in 2 mm increments to 49 mm. We reamed in  approximately 40 degrees of abduction, 20 degrees anteversion. A 50 mm  pinnacle acetabular shell was then impacted in anatomic position under  fluoroscopic guidance with excellent purchase. We did not need to place  any additional dome screws. A 32 mm neutral + 4 marathon liner was then  placed into the acetabular shell.       The femoral lift was then placed along the lateral aspect of the femur  just distal to the vastus ridge. The leg was  externally rotated and capsule  was stripped off the inferior aspect of the femoral neck down to the  level of the lesser trochanter, this was done with electrocautery. The femur was lifted after this was performed. The  leg was then placed in an extended and adducted position essentially delivering the femur. We also removed the capsule superiorly and the piriformis from the piriformis fossa to  gain excellent exposure of the  proximal femur. Rongeur was used to remove some cancellous bone to get  into the lateral portion of the proximal femur for placement of the  initial starter reamer. The starter broaches was placed  the starter broach  and was shown to go down the center of the canal. Broaching  with the Actis system was then performed starting at size 0  coursing  Up to size 5. A size 5 had excellent torsional and rotational  and axial stability. The trial high offset neck was then placed   with a 32 + 1 trial head. The hip was then reduced. We confirmed that  the stem was in the canal both on AP and lateral x-rays. It also has excellent sizing. The hip was reduced with outstanding stability through full extension and full external rotation.. AP pelvis was taken and the leg lengths were measured and found to be equal. Hip was then dislocated again and the femoral head and neck removed. The  femoral broach was removed. Size 5 Actis stem with a high offset  neck was then impacted into the femur following native anteversion. Has  excellent purchase in the canal. Excellent torsional and rotational and  axial stability. It is confirmed to be in the canal on AP and lateral  fluoroscopic views. The 32 + 1 metal head was placed and the hip  reduced with outstanding stability. Again AP pelvis was taken and it  confirmed that the leg lengths were equal. The wound was then copiously  irrigated with saline solution and the capsule reattached and repaired  with Ethibond suture. 30 ml of .25% Bupivicaine was  injected into the capsule and into the edge of the tensor fascia lata as well as subcutaneous tissue. The fascia overlying the tensor fascia lata was then closed with a running #1 V-Loc. Subcu was closed with interrupted 2-0 Vicryl and subcuticular running 4-0 Monocryl. Incision was cleaned  and dried. Steri-Strips and a bulky sterile dressing applied. The patient was awakened and transported to  recovery in stable condition.        Please note that a surgical assistant was a medical necessity for this procedure to perform it in a safe and expeditious manner. Assistant was necessary to provide appropriate retraction of vital neurovascular structures and to prevent femoral fracture and allow for anatomic placement of the prosthesis.  Gaynelle Arabian, M.D.

## 2021-08-05 NOTE — Interval H&P Note (Signed)
History and Physical Interval Note:  08/05/2021 10:39 AM  [name]  has presented today for surgery, with the diagnosis of right hip osteoarthritis.  The various methods of treatment have been discussed with the patient and family. After consideration of risks, benefits and other options for treatment, the patient has consented to  Procedure(s): TOTAL HIP ARTHROPLASTY ANTERIOR APPROACH (Right) as a surgical intervention.  The patient's history has been reviewed, patient examined, no change in status, stable for surgery.  I have reviewed the patient's chart and labs.  Questions were answered to the patient's satisfaction.     Natasha [name]

## 2021-08-05 NOTE — Anesthesia Procedure Notes (Signed)
Spinal  Patient location during procedure: OR Start time: 08/05/2021 11:50 AM End time: 08/05/2021 11:57 AM Reason for block: surgical anesthesia Staffing Performed: resident/CRNA  Anesthesiologist: Oleta Mouse, MD Resident/CRNA: Montel Clock, CRNA Performed by: Montel Clock, CRNA Authorized by: Oleta Mouse, MD   Preanesthetic Checklist Completed: patient identified, IV checked, risks and benefits discussed, surgical consent, monitors and equipment checked, pre-op evaluation and timeout performed Spinal Block Patient position: sitting Prep: DuraPrep Patient monitoring: heart rate, cardiac monitor, continuous pulse ox and blood pressure Approach: midline Location: L3-4 Injection technique: single-shot Needle Needle type: Pencan  Needle gauge: 24 G Needle length: 10 cm Needle insertion depth: 7 cm Assessment Sensory level: T6 Events: CSF return

## 2021-08-05 NOTE — Anesthesia Postprocedure Evaluation (Signed)
Anesthesia Post Note  Patient: Natasha [name]  Procedure(s) Performed: TOTAL HIP ARTHROPLASTY ANTERIOR APPROACH (Right: Hip)     Patient location during evaluation: PACU Anesthesia Type: MAC and Spinal Level of consciousness: awake and alert Pain management: pain level controlled Vital Signs Assessment: post-procedure vital signs reviewed and stable Respiratory status: spontaneous breathing, nonlabored ventilation, respiratory function stable and patient connected to nasal cannula oxygen Cardiovascular status: stable and blood pressure returned to baseline Postop Assessment: no apparent nausea or vomiting Anesthetic complications: no   No notable events documented.  Last Vitals:  Vitals:   08/05/21 1541 08/05/21 1854  BP: (!) 162/83 138/84  Pulse: 67 72  Resp: 16 16  Temp: 36.4 C 36.7 C  SpO2: 100% 97%    Last Pain:  Vitals:   08/05/21 1854  TempSrc: Oral  PainSc:                  Dalisa Forrer

## 2021-08-05 NOTE — Anesthesia Preprocedure Evaluation (Signed)
Anesthesia Evaluation  Patient identified by MRN, date of birth, ID band Patient awake    Reviewed: Allergy & Precautions, NPO status , Patient's Chart, lab work & pertinent test results  History of Anesthesia Complications (+) PONV and history of anesthetic complications  Airway Mallampati: I  TM Distance: >3 FB Neck ROM: Full    Dental  (+) Teeth Intact, Dental Advisory Given   Pulmonary    breath sounds clear to auscultation       Cardiovascular negative cardio ROS   Rhythm:Regular     Neuro/Psych PSYCHIATRIC DISORDERS Anxiety    GI/Hepatic Neg liver ROS, GERD  ,  Endo/Other  negative endocrine ROS  Renal/GU negative Renal ROS     Musculoskeletal  (+) Arthritis ,   Abdominal   Peds  Hematology negative hematology ROS (+) Lab Results      Component                Value               Date                      WBC                      6.6                 07/27/2021                HGB                      14.2                07/27/2021                HCT                      40.5                07/27/2021                MCV                      99.0                07/27/2021                PLT                      329                 07/27/2021              Anesthesia Other Findings   Reproductive/Obstetrics                             Anesthesia Physical Anesthesia Plan  ASA: 1  Anesthesia Plan: MAC and Spinal   Post-op Pain Management:    Induction: Intravenous  PONV Risk Score and Plan: 3 and Propofol infusion and Ondansetron  Airway Management Planned: Nasal Cannula and Natural Airway  Additional Equipment: None  Intra-op Plan:   Post-operative Plan:   Informed Consent: I have reviewed the patients History and Physical, chart, labs and discussed the procedure including the risks, benefits and alternatives for the proposed anesthesia with the patient or authorized  representative who has indicated his/her understanding  and acceptance.     Dental advisory given  Plan Discussed with: CRNA  Anesthesia Plan Comments:         Anesthesia Quick Evaluation

## 2021-08-05 NOTE — Discharge Instructions (Signed)
°Frank Aluisio, MD °Total Joint Specialist °[facility] °[address] °[place], [place] [address] °[phone] ° °ANTERIOR APPROACH TOTAL HIP REPLACEMENT POSTOPERATIVE DIRECTIONS ° ° ° ° °Hip Rehabilitation, Guidelines Following Surgery  °The results of a hip operation are greatly improved after range of motion and muscle strengthening exercises. Follow all safety measures which are given to protect your hip. If any of these exercises cause increased pain or swelling in your joint, decrease the amount until you are comfortable again. Then slowly increase the exercises. Call your caregiver if you have problems or questions.  ° °BLOOD CLOT PREVENTION °Take a 325 mg Aspirin two times a day for three weeks following surgery. Then take an 81 mg Aspirin once a day for three weeks. Then discontinue Aspirin. °You may resume your vitamins/supplements upon discharge from the hospital. °Do not take any NSAIDs (Advil, Aleve, Ibuprofen, Meloxicam, etc.) until you have discontinued the 325 mg Aspirin. ° °HOME CARE INSTRUCTIONS  °Remove items at home which could result in a fall. This includes throw rugs or furniture in walking pathways.  °ICE to the affected hip as frequently as 20-30 minutes an hour and then as needed for pain and swelling. Continue to use ice on the hip for pain and swelling from surgery. You may notice swelling that will progress down to the foot and ankle. This is normal after surgery. Elevate the leg when you are not up walking on it.   °Continue to use the breathing machine which will help keep your temperature down.  It is common for your temperature to cycle up and down following surgery, especially at night when you are not up moving around and exerting yourself.  The breathing machine keeps your lungs expanded and your temperature down. ° °DIET °You may resume your previous home diet once your are discharged from the hospital. ° °DRESSING / WOUND CARE / SHOWERING °You have  an adhesive waterproof bandage over the incision. Leave this in place until your first follow-up appointment. Once you remove this you will not need to place another bandage.  °You may begin showering 3 days following surgery, but do not submerge the incision under water. ° °ACTIVITY °For the first 3-5 days, it is important to rest and keep the operative leg elevated. You should, as a general rule, rest for 50 minutes and walk/stretch for 10 minutes per hour. After 5 days, you may slowly increase activity as tolerated.  °Perform the exercises you were provided twice a day for about 15-20 minutes each session. Begin these 2 days following surgery. °Walk with your walker as instructed. Use the walker until you are comfortable transitioning to a cane. Walk with the cane in the opposite hand of the operative leg. You may discontinue the cane once you are comfortable and walking steadily. °Avoid periods of inactivity such as sitting longer than an hour when not asleep. This helps prevent blood clots.  °Do not drive a car for 6 weeks or until released by your surgeon.  °Do not drive while taking narcotics. ° °TED HOSE STOCKINGS °Wear the elastic stockings on both legs for three weeks following surgery during the day. You may remove them at night while sleeping. ° °WEIGHT BEARING °Weight bearing as tolerated with assist device (walker, cane, etc) as directed, use it as long as suggested by your surgeon or therapist, typically at least 4-6 weeks. ° °POSTOPERATIVE CONSTIPATION PROTOCOL °Constipation - defined medically as fewer than three stools per week and severe constipation as   less than one stool per week. ° °One of the most common issues patients have following surgery is constipation.  Even if you have a regular bowel pattern at home, your normal regimen is likely to be disrupted due to multiple reasons following surgery.  Combination of anesthesia, postoperative narcotics, change in appetite and fluid intake all can  affect your bowels.  In order to avoid complications following surgery, here are some recommendations in order to help you during your recovery period. ° °Colace (docusate) - Pick up an over-the-counter form of Colace or another stool softener and take twice a day as long as you are requiring postoperative pain medications.  Take with a full glass of water daily.  If you experience loose stools or diarrhea, hold the colace until you stool forms back up.  If your symptoms do not get better within 1 week or if they get worse, check with your doctor. °Dulcolax (bisacodyl) - Pick up over-the-counter and take as directed by the product packaging as needed to assist with the movement of your bowels.  Take with a full glass of water.  Use this product as needed if not relieved by Colace only.  °MiraLax (polyethylene glycol) - Pick up over-the-counter to have on hand.  MiraLax is a solution that will increase the amount of water in your bowels to assist with bowel movements.  Take as directed and can mix with a glass of water, juice, soda, coffee, or tea.  Take if you go more than two days without a movement.Do not use MiraLax more than once per day. Call your doctor if you are still constipated or irregular after using this medication for 7 days in a row. ° °If you continue to have problems with postoperative constipation, please contact the office for further assistance and recommendations.  If you experience "the worst abdominal pain ever" or develop nausea or vomiting, please contact the office immediatly for further recommendations for treatment. ° °ITCHING ° If you experience itching with your medications, try taking only a single pain pill, or even half a pain pill at a time.  You can also use Benadryl over the counter for itching or also to help with sleep.  ° °MEDICATIONS °See your medication summary on the “After Visit Summary” that the nursing staff will review with you prior to discharge.  You may have some home  medications which will be placed on hold until you complete the course of blood thinner medication.  It is important for you to complete the blood thinner medication as prescribed by your surgeon.  Continue your approved medications as instructed at time of discharge. ° °PRECAUTIONS °If you experience chest pain or shortness of breath - call 911 immediately for transfer to the hospital emergency department.  °If you develop a fever greater that 101 F, purulent drainage from wound, increased redness or drainage from wound, foul odor from the wound/dressing, or calf pain - CONTACT YOUR SURGEON.   °                                                °FOLLOW-UP APPOINTMENTS °Make sure you keep all of your appointments after your operation with your surgeon and caregivers. You should call the office at the above phone number and make an appointment for approximately two weeks after the date of your surgery or on the   date instructed by your surgeon outlined in the "After Visit Summary". ° °RANGE OF MOTION AND STRENGTHENING EXERCISES  °These exercises are designed to help you keep full movement of your hip joint. Follow your caregiver's or physical therapist's instructions. Perform all exercises about fifteen times, three times per day or as directed. Exercise both hips, even if you have had only one joint replacement. These exercises can be done on a training (exercise) mat, on the floor, on a table or on a bed. Use whatever works the best and is most comfortable for you. Use music or television while you are exercising so that the exercises are a pleasant break in your day. This will make your life better with the exercises acting as a break in routine you can look forward to.  °Lying on your back, slowly slide your foot toward your buttocks, raising your knee up off the floor. Then slowly slide your foot back down until your leg is straight again.  °Lying on your back spread your legs as far apart as you can without causing  discomfort.  °Lying on your side, raise your upper leg and foot straight up from the floor as far as is comfortable. Slowly lower the leg and repeat.  °Lying on your back, tighten up the muscle in the front of your thigh (quadriceps muscles). You can do this by keeping your leg straight and trying to raise your heel off the floor. This helps strengthen the largest muscle supporting your knee.  °Lying on your back, tighten up the muscles of your buttocks both with the legs straight and with the knee bent at a comfortable angle while keeping your heel on the floor.  ° °POST-OPERATIVE OPIOID TAPER INSTRUCTIONS: °It is important to wean off of your opioid medication as soon as possible. If you do not need pain medication after your surgery it is ok to stop day one. °Opioids include: °Codeine, Hydrocodone(Norco, Vicodin), Oxycodone(Percocet, oxycontin) and hydromorphone amongst others.  °Long term and even short term use of opiods can cause: °Increased pain response °Dependence °Constipation °Depression °Respiratory depression °And more.  °Withdrawal symptoms can include °Flu like symptoms °Nausea, vomiting °And more °Techniques to manage these symptoms °Hydrate well °Eat regular healthy meals °Stay active °Use relaxation techniques(deep breathing, meditating, yoga) °Do Not substitute Alcohol to help with tapering °If you have been on opioids for less than two weeks and do not have pain than it is ok to stop all together.  °Plan to wean off of opioids °This plan should start within one week post op of your joint replacement. °Maintain the same interval or time between taking each dose and first decrease the dose.  °Cut the total daily intake of opioids by one tablet each day °Next start to increase the time between doses. °The last dose that should be eliminated is the evening dose.  ° °IF YOU ARE TRANSFERRED TO A SKILLED REHAB FACILITY °If the patient is transferred to a skilled rehab facility following release from the  hospital, a list of the current medications will be sent to the facility for the patient to continue.  When discharged from the skilled rehab facility, please have the facility set up the patient's Home Health Physical Therapy prior to being released. Also, the skilled facility will be responsible for providing the patient with their medications at time of release from the facility to include their pain medication, the muscle relaxants, and their blood thinner medication. If the patient is still at the rehab facility   at time of the two week follow up appointment, the skilled rehab facility will also need to assist the patient in arranging follow up appointment in our office and any transportation needs. ° °MAKE SURE YOU:  °Understand these instructions.  °Get help right away if you are not doing well or get worse.  ° ° °DENTAL ANTIBIOTICS: ° °In most cases prophylactic antibiotics for Dental procdeures after total joint surgery are not necessary. ° °Exceptions are as follows: ° °1. History of prior total joint infection ° °2. Severely immunocompromised (Organ Transplant, cancer chemotherapy, Rheumatoid biologic °meds such as Humera) ° °3. Poorly controlled diabetes (A1C &gt; 8.0, blood glucose over 200) ° °If you have one of these conditions, contact your surgeon for an antibiotic prescription, prior to your °dental procedure.  ° ° °Pick up stool softner and laxative for home use following surgery while on pain medications. °Do not submerge incision under water. °Please use good hand washing techniques while changing dressing each day. °May shower starting three days after surgery. °Please use a clean towel to pat the incision dry following showers. °Continue to use ice for pain and swelling after surgery. °Do not use any lotions or creams on the incision until instructed by your surgeon. ° °

## 2021-08-05 NOTE — Evaluation (Signed)
Physical Therapy Evaluation Patient Details Name: Natasha [name] MRN: [id number] DOB: Jan 23, 1945 Today's Date: 08/05/2021  History of Present Illness  Pt is a 76yo female presenting /sp R-THA, AA on 08/05/21. PMH: GERD, HLD, lumbar spinal stenosis, R-RCR 2015, L-THA-AA 2016, vertigo   Clinical Impression  Natasha [name] is a 76 y.o. female POD 0 s/p R-THA, AA. Patient reports modified independence with mobility at baseline. Pt reports two falls this week: 1) Mon 7/31 pt reports fell backwards into tub and hit her head on the shower wall and ended up in fetal position in tub, denies LOC/headache/dizziness/diplopia, visual inspection of occiput no broken skin but pt has palpable "goose egg." 2) Tues 8/1 pt fell while making bed and tripped over a blanket, "my reflexes weren't fast enough to break my fall." RN notified and MD messaged. Patient is now limited by functional impairments (see PT problem list below) and requires min assist for bed mobility to dangle EOB, further mobility deferred as pt has not yet cleared spinal enough for OOB mobility. Patient instructed in exercise to facilitate ROM and circulation to manage edema. Provided incentive spirometer and with Vcs pt able to achieve 1152m. Patient will benefit from continued skilled PT interventions to address impairments and progress towards PLOF. Acute PT will follow to progress mobility and stair training in preparation for safe discharge home.       Recommendations for follow up therapy are one component of a multi-disciplinary discharge planning process, led by the attending physician.  Recommendations may be updated based on patient status, additional functional criteria and insurance authorization.  Follow Up Recommendations Follow physician's recommendations for discharge plan and follow up therapies      Assistance Recommended at Discharge Frequent or constant Supervision/Assistance  Patient can return home with the following  A  little help with walking and/or transfers;A lot of help with bathing/dressing/bathroom;Assistance with cooking/housework;Assist for transportation;Help with stairs or ramp for entrance    Equipment Recommendations None recommended by PT  Recommendations for Other Services       Functional Status Assessment Patient has had a recent decline in their functional status and demonstrates the ability to make significant improvements in function in a reasonable and predictable amount of time.     Precautions / Restrictions Precautions Precautions: Fall Precaution Comments: Pt with very recent history of falls. Restrictions Weight Bearing Restrictions: No Other Position/Activity Restrictions: wbat      Mobility  Bed Mobility Overal bed mobility: Needs Assistance Bed Mobility: Supine to Sit, Sit to Supine     Supine to sit: Min assist Sit to supine: Min assist   General bed mobility comments: Min assist to bring RLE off bed and back onto bed after dangling. further mobility deferred as pt cannot feel nor fire glute muscles.    Transfers                   General transfer comment: deferred    Ambulation/Gait               General Gait Details: deferred  Stairs            Wheelchair Mobility    Modified Rankin (Stroke Patients Only)       Balance Overall balance assessment: Mild deficits observed, not formally tested  Pertinent Vitals/Pain Pain Assessment Pain Assessment: 0-10 Pain Score: 7  Pain Location: Right hip Pain Descriptors / Indicators: Operative site guarding, Discomfort Pain Intervention(s): Limited activity within patient's tolerance, Monitored during session, Ice applied    Home Living Family/patient expects to be discharged to:: Private residence Living Arrangements: Spouse/significant other Available Help at Discharge: Family;Available 24 hours/day Type of Home:  House Home Access: Stairs to enter Entrance Stairs-Rails: Right Entrance Stairs-Number of Steps: 6   Home Layout: Able to live on main level with bedroom/bathroom Home Equipment: Cane - single point;Rolling Walker (2 wheels);BSC/3in1;Shower seat;Tub bench      Prior Function Prior Level of Function : Independent/Modified Independent;History of Falls (last six months)             Mobility Comments: Furniture surfs at home. Day before yesterday she fell backwards over the tub lip and hit her head on the shower wall and ended up in a fetal position in tub, denies headache/diplopia, inspected back of head and no broken skin, slight "goose egg." Also fell yesterday while making her bed and tripped over the blanket and was unable to catch herself. ADLs Comments: IND     Hand Dominance   Dominant Hand: Right    Extremity/Trunk Assessment   Upper Extremity Assessment Upper Extremity Assessment: Overall WFL for tasks assessed    Lower Extremity Assessment Lower Extremity Assessment: RLE deficits/detail;LLE deficits/detail RLE Deficits / Details: MMT ank PF/DF 3+/5 RLE Sensation: decreased light touch LLE Deficits / Details: MMT ank PF/DF 3+/5 LLE Sensation: decreased light touch    Cervical / Trunk Assessment Cervical / Trunk Assessment: Kyphotic  Communication   Communication: No difficulties  Cognition Arousal/Alertness: Awake/alert Behavior During Therapy: WFL for tasks assessed/performed Overall Cognitive Status: Within Functional Limits for tasks assessed                                          General Comments      Exercises Total Joint Exercises Ankle Circles/Pumps: AROM, Both, 10 reps   Assessment/Plan    PT Assessment Patient needs continued PT services  PT Problem List Decreased strength;Decreased range of motion;Decreased activity tolerance;Decreased balance;Decreased mobility;Decreased coordination;Pain       PT Treatment  Interventions DME instruction;Gait training;Stair training;Functional mobility training;Therapeutic activities;Therapeutic exercise;Balance training;Neuromuscular re-education;Patient/family education    PT Goals (Current goals can be found in the Care Plan section)  Acute Rehab PT Goals Patient Stated Goal: Climb upstairs PT Goal Formulation: With patient Time For Goal Achievement: 08/12/21 Potential to Achieve Goals: Good    Frequency 7X/week     Co-evaluation               AM-PAC PT "6 Clicks" Mobility  Outcome Measure Help needed turning from your back to your side while in a flat bed without using bedrails?: A Little Help needed moving from lying on your back to sitting on the side of a flat bed without using bedrails?: A Little Help needed moving to and from a bed to a chair (including a wheelchair)?: A Little Help needed standing up from a chair using your arms (e.g., wheelchair or bedside chair)?: A Little Help needed to walk in hospital room?: A Little Help needed climbing 3-5 steps with a railing? : A Lot 6 Click Score: 17    End of Session   Activity Tolerance: Patient tolerated treatment well;No increased pain Patient left: in bed;with  call bell/phone within reach;with bed alarm set Nurse Communication: Mobility status PT Visit Diagnosis: Pain;Difficulty in walking, not elsewhere classified (R26.2) Pain - Right/Left: Right Pain - part of body: Hip    Time: 8099-8338 PT Time Calculation (min) (ACUTE ONLY): 19 min   Charges:   PT Evaluation $PT Eval Low Complexity: 1 Low          Coolidge Breeze, PT, DPT [facility] Office: [phone] Pager: [phone]  Coolidge Breeze 08/05/2021, 6:12 PM

## 2021-08-06 DIAGNOSIS — Z79899 Other long term (current) drug therapy: Secondary | ICD-10-CM | POA: Diagnosis not present

## 2021-08-06 DIAGNOSIS — M1611 Unilateral primary osteoarthritis, right hip: Secondary | ICD-10-CM | POA: Diagnosis not present

## 2021-08-06 DIAGNOSIS — Z96642 Presence of left artificial hip joint: Secondary | ICD-10-CM | POA: Diagnosis not present

## 2021-08-06 LAB — CBC
HCT: 34.9 % — ABNORMAL LOW (ref 36.0–46.0)
Hemoglobin: 12.3 g/dL (ref 12.0–15.0)
MCH: 34.8 pg — ABNORMAL HIGH (ref 26.0–34.0)
MCHC: 35.2 g/dL (ref 30.0–36.0)
MCV: 98.9 fL (ref 80.0–100.0)
Platelets: 299 10*3/uL (ref 150–400)
RBC: 3.53 MIL/uL — ABNORMAL LOW (ref 3.87–5.11)
RDW: 11.9 % (ref 11.5–15.5)
WBC: 10.5 10*3/uL (ref 4.0–10.5)
nRBC: 0 % (ref 0.0–0.2)

## 2021-08-06 LAB — BASIC METABOLIC PANEL
Anion gap: 8 (ref 5–15)
BUN: 11 mg/dL (ref 8–23)
CO2: 24 mmol/L (ref 22–32)
Calcium: 8 mg/dL — ABNORMAL LOW (ref 8.9–10.3)
Chloride: 103 mmol/L (ref 98–111)
Creatinine, Ser: 0.71 mg/dL (ref 0.44–1.00)
GFR, Estimated: >60 mL/min (ref >60)
Glucose, Bld: 183 mg/dL — ABNORMAL HIGH (ref 70–99)
Potassium: 3.7 mmol/L (ref 3.5–5.1)
Sodium: 135 mmol/L (ref 135–145)

## 2021-08-06 MED ORDER — METHOCARBAMOL 500 MG PO TABS
500.0000 mg | ORAL_TABLET | Freq: Four times a day (QID) | ORAL | 0 refills | Status: DC | PRN
Start: 2021-08-06 — End: 2021-08-07

## 2021-08-06 MED ORDER — TRAMADOL HCL 50 MG PO TABS
50.0000 mg | ORAL_TABLET | Freq: Four times a day (QID) | ORAL | 0 refills | Status: DC | PRN
Start: 2021-08-06 — End: 2021-08-07

## 2021-08-06 MED ORDER — ASPIRIN 325 MG PO TBEC: 325.0000 mg | DELAYED_RELEASE_TABLET | Freq: Two times a day (BID) | ORAL | 0 refills | Status: DC

## 2021-08-06 MED ORDER — OXYCODONE HCL 5 MG PO TABS: 5.0000 mg | ORAL_TABLET | Freq: Four times a day (QID) | ORAL | 0 refills | Status: DC | PRN

## 2021-08-06 NOTE — TOC Transition Note (Signed)
Transition of Care [facility]) - CM/SW Discharge Note  Patient Details  Name: Natasha [name] MRN: [id number] Date of Birth: 08-23-1945  Transition of Care [facility]) CM/SW Contact:  Sherie Don, LCSW Phone Number: 08/06/2021, 10:16 AM  Clinical Narrative: Patient is expected to discharge home after working with PT. CSW met with patient to confirm discharge plan. Patient will go home with a home exercise program (HEP). Patient has a rolling walker, BSC, transfer bench, and cane at home so there are no DME needs at this time. TOC signing off.    Final next level of care: Home/Self Care Barriers to Discharge: No Barriers Identified  Patient Goals and CMS Choice Patient states their goals for this hospitalization and ongoing recovery are:: Discharge home with HEP Choice offered to / list presented to : NA  Discharge Plan and Services       DME Arranged: N/A DME Agency: NA HH Arranged: NA [place] Agency: NA  Readmission Risk Interventions     No data to display

## 2021-08-06 NOTE — Plan of Care (Signed)

## 2021-08-06 NOTE — Progress Notes (Signed)
Subjective: 1 Day Post-Op Procedure(s) (LRB): TOTAL HIP ARTHROPLASTY ANTERIOR APPROACH (Right) Patient seen in rounds by Dr. Wynelle Link. Patient is well. Reports some nausea over night but seems to have resolved. Denies SOB or chest pain. Foley cath removed this AM. Voiding without difficulty. Patient reports pain as moderate.  Improved with pain medication. Worked with physical therapy yesterday. We will continue therapy today.  Of note, patient reports that they had two falls earlier this week. Denies headache or dizziness.  Objective: Vital signs in last 24 hours: Temp:  [97.1 F (36.2 C)-98.2 F (36.8 C)] 98.2 F (36.8 C) (08/03 0542) Pulse Rate:  [56-84] 68 (08/03 0542) Resp:  [11-17] 17 (08/03 0542) BP: (105-162)/(64-86) 113/64 (08/03 0542) SpO2:  [94 %-100 %] 98 % (08/03 0542) Weight:  [72.6 kg-77.3 kg] 77.3 kg (08/03 0029)  Intake/Output from previous day:  Intake/Output Summary (Last 24 hours) at 08/06/2021 0746 Last data filed at 08/06/2021 0616 Gross per 24 hour  Intake 2619.82 ml  Output 3650 ml  Net -1030.18 ml     Intake/Output this shift: No intake/output data recorded.  Labs: Recent Labs    08/06/21 0239  HGB 12.3   Recent Labs    08/06/21 0239  WBC 10.5  RBC 3.53*  HCT 34.9*  PLT 299   Recent Labs    08/06/21 0239  NA 135  K 3.7  CL 103  CO2 24  BUN 11  CREATININE 0.71  GLUCOSE 183*  CALCIUM 8.0*   No results for input(s): "LABPT", "INR" in the last 72 hours.  Exam: General - Patient is Alert and Oriented Extremity - Neurologically intact Neurovascular intact Sensation intact distally Dorsiflexion/Plantar flexion intact Dressing - dressing C/D/I Motor Function - intact, moving foot and toes well on exam.  Past Medical History:  Diagnosis Date   Arthritis    osteoarthritis L hip- job injury   Cataract    "being monitored"-bilateral   Chronic cystitis    no problems   DDD (degenerative disc disease), cervical    Diverticulosis     no problems   GERD (gastroesophageal reflux disease)    occasional - diet controlled - no meds   History of chicken pox    History of positive PPD    History of shingles    Hyperlipidemia    under control   Incontinence of urine    sometimes   Internal hemorrhoids    Lumbar spinal stenosis    Measles    Mumps    Neck pain    Palpitations    PONV (postoperative nausea and vomiting)    "hard time waking up with first colonoscopy-no problems since"   Rubella    Seasonal allergies    "year around"   SVD (spontaneous vaginal delivery)    x 2   Tinnitus    with daily aspirin intake    Assessment/Plan: 1 Day Post-Op Procedure(s) (LRB): TOTAL HIP ARTHROPLASTY ANTERIOR APPROACH (Right) Principal Problem:   Primary osteoarthritis of right hip  Estimated body mass index is 31.17 kg/m as calculated from the following:   Height as of this encounter: '5\' 2"'$  (1.575 m).   Weight as of this encounter: 77.3 kg. Advance diet Up with therapy D/C IV fluids  DVT Prophylaxis - Aspirin Weight bearing as tolerated. Continue physical therapy.  Plan is to go Home after hospital stay. Expected discharge today pending progress with physical therapy and symptom management. Will do HEP once discharged. Follow-up in clinic in 2 weeks.  The  PDMP database was reviewed today prior to any opioid medications being prescribed to this patient.  R. Jaynie Bream, PA-C Orthopedic Surgery [phone] 08/06/2021, 7:46 AM

## 2021-08-06 NOTE — Care Plan (Signed)
Ortho Bundle Case Management Note  Patient Details  Name: Natasha [name] MRN: [id number] Date of Birth: 09-22-1945  R THA on 08-05-21 DCP:  Home with husband DME:  No needs, has a RW PT: HEP                  DME Arranged:  N/A DME Agency:  NA  HH Arranged:  NA HH Agency:  NA  Additional Comments: Please contact me with any questions of if this plan should need to change.  Marianne Sofia, RN,CCM [facility]  [phone] 08/06/2021, 9:03 AM

## 2021-08-06 NOTE — Progress Notes (Addendum)
Physical Therapy Treatment Patient Details Name: Natasha [name] MRN: [id number] DOB: 03-10-1945 Today's Date: 08/06/2021   History of Present Illness Pt is a 76yo female presenting /sp R-THA, AA on 08/05/21. PMH: GERD, HLD, lumbar spinal stenosis, R-RCR 2015, L-THA-AA 2016, vertigo    PT Comments    Pt ambulated 33' with RW, no loss of balance, activity tolerance limited by pain and nausea. RN administered reglan at end of PT session. Good progress expected once nausea resolves.  She is not yet ready to DC home.   Recommendations for follow up therapy are one component of a multi-disciplinary discharge planning process, led by the attending physician.  Recommendations may be updated based on patient status, additional functional criteria and insurance authorization.  Follow Up Recommendations  Follow physician's recommendations for discharge plan and follow up therapies     Assistance Recommended at Discharge Frequent or constant Supervision/Assistance  Patient can return home with the following A little help with walking and/or transfers;A lot of help with bathing/dressing/bathroom;Assistance with cooking/housework;Assist for transportation;Help with stairs or ramp for entrance   Equipment Recommendations  None recommended by PT    Recommendations for Other Services       Precautions / Restrictions Precautions Precautions: Fall Precaution Comments: 2 falls in the past week. Pt denies other falls in past 6 months Restrictions Weight Bearing Restrictions: No Other Position/Activity Restrictions: wbat     Mobility  Bed Mobility Overal bed mobility: Needs Assistance Bed Mobility: Supine to Sit, Sit to Supine     Supine to sit: Mod assist Sit to supine: Min assist   General bed mobility comments: min A for BLEs into bed    Transfers Overall transfer level: Needs assistance Equipment used: Rolling walker (2 wheels) Transfers: Sit to/from Stand, Bed to  chair/wheelchair/BSC Sit to Stand: Min guard   Step pivot transfers: Min guard       General transfer comment: VCs hand placement,    Ambulation/Gait Ambulation/Gait assistance: Min guard Gait Distance (Feet): 54 Feet Assistive device: Rolling walker (2 wheels) Gait Pattern/deviations: Step-to pattern, Decreased step length - right, Decreased step length - left Gait velocity: decr     General Gait Details: steady with RW, distance limited by pain and nausea   Stairs             Wheelchair Mobility    Modified Rankin (Stroke Patients Only)       Balance Overall balance assessment: History of Falls, Needs assistance Sitting-balance support: Feet supported Sitting balance-Leahy Scale: Fair     Standing balance support: Reliant on assistive device for balance, Bilateral upper extremity supported Standing balance-Leahy Scale: Poor                              Cognition Arousal/Alertness: Awake/alert Behavior During Therapy: WFL for tasks assessed/performed Overall Cognitive Status: Within Functional Limits for tasks assessed                                          Exercises Total Joint Exercises Ankle Circles/Pumps: AROM, Both, 10 reps Short Arc Quad: AROM, Right, 10 reps, Supine Heel Slides: 15 reps, AAROM, Right Hip ABduction/ADduction: AAROM, Right, 10 reps, Supine    General Comments        Pertinent Vitals/Pain Pain Assessment Pain Score: 7  Pain Location: Right hip Pain Descriptors / Indicators:  Operative site guarding, Discomfort Pain Intervention(s): Limited activity within patient's tolerance, Monitored during session, Repositioned, Ice applied, Patient requesting pain meds-RN notified    Home Living                          Prior Function            PT Goals (current goals can now be found in the care plan section) Acute Rehab PT Goals Patient Stated Goal: Climb upstairs PT Goal Formulation:  With patient Time For Goal Achievement: 08/12/21 Potential to Achieve Goals: Good Progress towards PT goals: Progressing toward goals    Frequency    7X/week      PT Plan Current plan remains appropriate    Co-evaluation              AM-PAC PT "6 Clicks" Mobility   Outcome Measure  Help needed turning from your back to your side while in a flat bed without using bedrails?: A Little Help needed moving from lying on your back to sitting on the side of a flat bed without using bedrails?: A Lot Help needed moving to and from a bed to a chair (including a wheelchair)?: A Little Help needed standing up from a chair using your arms (e.g., wheelchair or bedside chair)?: A Little Help needed to walk in hospital room?: A Little Help needed climbing 3-5 steps with a railing? : A Lot 6 Click Score: 16    End of Session Equipment Utilized During Treatment: Gait belt Activity Tolerance: Treatment limited secondary to medical complications (Comment) (nausea) Patient left: with call bell/phone within reach;with family/visitor present;in bed;with bed alarm set Nurse Communication: Mobility status PT Visit Diagnosis: Pain;Difficulty in walking, not elsewhere classified (R26.2) Pain - Right/Left: Right Pain - part of body: Hip     Time: 3557-3220 PT Time Calculation (min) (ACUTE ONLY): 16 min  Charges:  $Gait Training: 8-22 mins                     Blondell Reveal Kistler PT 08/06/2021  Acute Rehabilitation Services  Office [phone]

## 2021-08-06 NOTE — Progress Notes (Signed)
Physical Therapy Treatment Patient Details Name: Natasha [name] MRN: [id number] DOB: 05/16/1945 Today's Date: 08/06/2021   History of Present Illness Pt is a 76yo female presenting /sp R-THA, AA on 08/05/21. PMH: GERD, HLD, lumbar spinal stenosis, R-RCR 2015, L-THA-AA 2016, vertigo    PT Comments    Initiated THA HEP. Assisted pt to pivot from bed to recliner with RW. Ambulation deferred 2* nausea, RN aware. Good progress expected once nausea resolves. Pt reported 2 falls this week prior to admission, she denies other falls in past 6 months.    Recommendations for follow up therapy are one component of a multi-disciplinary discharge planning process, led by the attending physician.  Recommendations may be updated based on patient status, additional functional criteria and insurance authorization.  Follow Up Recommendations  Follow physician's recommendations for discharge plan and follow up therapies     Assistance Recommended at Discharge Frequent or constant Supervision/Assistance  Patient can return home with the following A little help with walking and/or transfers;A lot of help with bathing/dressing/bathroom;Assistance with cooking/housework;Assist for transportation;Help with stairs or ramp for entrance   Equipment Recommendations  None recommended by PT    Recommendations for Other Services       Precautions / Restrictions Precautions Precautions: Fall Precaution Comments: 2 falls in the past week. Pt denies other falls in past 6 months Restrictions Weight Bearing Restrictions: No Other Position/Activity Restrictions: wbat     Mobility  Bed Mobility Overal bed mobility: Needs Assistance Bed Mobility: Supine to Sit     Supine to sit: Mod assist     General bed mobility comments: assist to raise trunk and pivot hips to EOB    Transfers Overall transfer level: Needs assistance Equipment used: Rolling walker (2 wheels) Transfers: Sit to/from Stand, Bed to  chair/wheelchair/BSC Sit to Stand: Min assist   Step pivot transfers: Min guard       General transfer comment: VCs hand placement, min A to power up, pt took a few pivotal steps bed to recliner, further ambulation deferred 2* nausea, RN gave zofran at end of PT session    Ambulation/Gait                   Stairs             Wheelchair Mobility    Modified Rankin (Stroke Patients Only)       Balance Overall balance assessment: History of Falls, Needs assistance Sitting-balance support: Feet supported Sitting balance-Leahy Scale: Fair     Standing balance support: Reliant on assistive device for balance, Bilateral upper extremity supported Standing balance-Leahy Scale: Poor                              Cognition Arousal/Alertness: Awake/alert Behavior During Therapy: WFL for tasks assessed/performed Overall Cognitive Status: Within Functional Limits for tasks assessed                                          Exercises Total Joint Exercises Ankle Circles/Pumps: AROM, Both, 10 reps Short Arc Quad: AROM, Right, 10 reps, Supine Heel Slides: AAROM, Right, 10 reps, Supine Hip ABduction/ADduction: AAROM, Right, 10 reps, Supine    General Comments        Pertinent Vitals/Pain Pain Assessment Pain Score: 6  Pain Location: Right hip Pain Descriptors / Indicators: Operative site guarding,  Discomfort Pain Intervention(s): Limited activity within patient's tolerance, Monitored during session, Premedicated before session, Ice applied    Home Living                          Prior Function            PT Goals (current goals can now be found in the care plan section) Acute Rehab PT Goals Patient Stated Goal: Climb upstairs PT Goal Formulation: With patient Time For Goal Achievement: 08/12/21 Potential to Achieve Goals: Good Progress towards PT goals: Progressing toward goals    Frequency    7X/week       PT Plan Current plan remains appropriate    Co-evaluation              AM-PAC PT "6 Clicks" Mobility   Outcome Measure  Help needed turning from your back to your side while in a flat bed without using bedrails?: A Little Help needed moving from lying on your back to sitting on the side of a flat bed without using bedrails?: A Lot Help needed moving to and from a bed to a chair (including a wheelchair)?: A Little Help needed standing up from a chair using your arms (e.g., wheelchair or bedside chair)?: A Little Help needed to walk in hospital room?: A Little Help needed climbing 3-5 steps with a railing? : A Lot 6 Click Score: 16    End of Session Equipment Utilized During Treatment: Gait belt Activity Tolerance: Treatment limited secondary to medical complications (Comment) (nausea) Patient left: with call bell/phone within reach;in chair;with chair alarm set;with family/visitor present Nurse Communication: Mobility status PT Visit Diagnosis: Pain;Difficulty in walking, not elsewhere classified (R26.2) Pain - Right/Left: Right Pain - part of body: Hip     Time: 5625-6389 PT Time Calculation (min) (ACUTE ONLY): 16 min  Charges:  $Therapeutic Activity: 8-22 mins                    Blondell Reveal Kistler PT 08/06/2021  Acute Rehabilitation Services  Office [phone]

## 2021-08-06 NOTE — Plan of Care (Signed)

## 2021-08-07 ENCOUNTER — Encounter (HOSPITAL_COMMUNITY): Payer: Self-pay | Admitting: Orthopedic Surgery

## 2021-08-07 ENCOUNTER — Other Ambulatory Visit (HOSPITAL_COMMUNITY): Payer: Self-pay

## 2021-08-07 DIAGNOSIS — M1611 Unilateral primary osteoarthritis, right hip: Secondary | ICD-10-CM | POA: Diagnosis not present

## 2021-08-07 DIAGNOSIS — Z96642 Presence of left artificial hip joint: Secondary | ICD-10-CM | POA: Diagnosis not present

## 2021-08-07 DIAGNOSIS — Z79899 Other long term (current) drug therapy: Secondary | ICD-10-CM | POA: Diagnosis not present

## 2021-08-07 LAB — CBC
HCT: 34.9 % — ABNORMAL LOW (ref 36.0–46.0)
Hemoglobin: 12.3 g/dL (ref 12.0–15.0)
MCH: 34.7 pg — ABNORMAL HIGH (ref 26.0–34.0)
MCHC: 35.2 g/dL (ref 30.0–36.0)
MCV: 98.6 fL (ref 80.0–100.0)
Platelets: 282 10*3/uL (ref 150–400)
RBC: 3.54 MIL/uL — ABNORMAL LOW (ref 3.87–5.11)
RDW: 12 % (ref 11.5–15.5)
WBC: 13 10*3/uL — ABNORMAL HIGH (ref 4.0–10.5)
nRBC: 0 % (ref 0.0–0.2)

## 2021-08-07 MED ORDER — ASPIRIN 325 MG PO TBEC
325.0000 mg | DELAYED_RELEASE_TABLET | Freq: Two times a day (BID) | ORAL | 0 refills | Status: AC
  Filled 2021-08-07: qty 40, 20d supply, fill #0

## 2021-08-07 MED ORDER — TRAMADOL HCL 50 MG PO TABS
50.0000 mg | ORAL_TABLET | Freq: Four times a day (QID) | ORAL | 0 refills | Status: AC | PRN
Start: 2021-08-07 — End: ?
  Filled 2021-08-07: qty 40, 10d supply, fill #0

## 2021-08-07 MED ORDER — OXYCODONE HCL 5 MG PO TABS
5.0000 mg | ORAL_TABLET | Freq: Four times a day (QID) | ORAL | 0 refills | Status: AC | PRN
  Filled 2021-08-07: qty 42, 6d supply, fill #0

## 2021-08-07 MED ORDER — ONDANSETRON HCL 4 MG PO TABS
4.0000 mg | ORAL_TABLET | Freq: Four times a day (QID) | ORAL | 0 refills | Status: AC | PRN
  Filled 2021-08-07: qty 20, 30d supply, fill #0

## 2021-08-07 MED ORDER — ONDANSETRON HCL 4 MG PO TABS
4.0000 mg | ORAL_TABLET | Freq: Four times a day (QID) | ORAL | 0 refills | Status: DC | PRN
Start: 2021-08-07 — End: 2021-08-07

## 2021-08-07 MED ORDER — METHOCARBAMOL 500 MG PO TABS
500.0000 mg | ORAL_TABLET | Freq: Four times a day (QID) | ORAL | 0 refills | Status: AC | PRN
  Filled 2021-08-07 (×2): qty 40, 10d supply, fill #0

## 2021-08-07 NOTE — Progress Notes (Signed)
   Subjective: 2 Days Post-Op Procedure(s) (LRB): TOTAL HIP ARTHROPLASTY ANTERIOR APPROACH (Right) Patient seen in rounds by Dr. Wynelle Link. Patient is  well . Continued to struggle with nausea yesterday. Nausea seems to have improved this morning. Patient reports pain as mild. Worked with physical therapy yesterday and ambulated 54'.  Objective: Vital signs in last 24 hours: Temp:  [97.7 F (36.5 C)-98.5 F (36.9 C)] 97.8 F (36.6 C) (08/04 0522) Pulse Rate:  [61-63] 63 (08/04 0522) Resp:  [16-17] 16 (08/04 0522) BP: (112-144)/(70-85) 112/71 (08/04 0522) SpO2:  [96 %-100 %] 96 % (08/04 0522)  Intake/Output from previous day:  Intake/Output Summary (Last 24 hours) at 08/07/2021 0731 Last data filed at 08/06/2021 2316 Gross per 24 hour  Intake 1543.93 ml  Output 1600 ml  Net -56.07 ml    Intake/Output this shift: No intake/output data recorded.  Labs: Recent Labs    08/06/21 0239 08/07/21 0251  HGB 12.3 12.3   Recent Labs    08/06/21 0239 08/07/21 0251  WBC 10.5 13.0*  RBC 3.53* 3.54*  HCT 34.9* 34.9*  PLT 299 282   Recent Labs    08/06/21 0239  NA 135  K 3.7  CL 103  CO2 24  BUN 11  CREATININE 0.71  GLUCOSE 183*  CALCIUM 8.0*   No results for input(s): "LABPT", "INR" in the last 72 hours.  Exam: General - Patient is Alert and Oriented Extremity - Neurologically intact Neurovascular intact Sensation intact distally Dorsiflexion/Plantar flexion intact Dressing/Incision - clean, dry, no drainage Motor Function - intact, moving foot and toes well on exam.  Past Medical History:  Diagnosis Date   Arthritis    osteoarthritis L hip- job injury   Cataract    "being monitored"-bilateral   Chronic cystitis    no problems   DDD (degenerative disc disease), cervical    Diverticulosis    no problems   GERD (gastroesophageal reflux disease)    occasional - diet controlled - no meds   History of chicken pox    History of positive PPD    History of  shingles    Hyperlipidemia    under control   Incontinence of urine    sometimes   Internal hemorrhoids    Lumbar spinal stenosis    Measles    Mumps    Neck pain    Palpitations    PONV (postoperative nausea and vomiting)    "hard time waking up with first colonoscopy-no problems since"   Rubella    Seasonal allergies    "year around"   SVD (spontaneous vaginal delivery)    x 2   Tinnitus    with daily aspirin intake    Assessment/Plan: 2 Days Post-Op Procedure(s) (LRB): TOTAL HIP ARTHROPLASTY ANTERIOR APPROACH (Right) Principal Problem:   Primary osteoarthritis of right hip  Estimated body mass index is 31.17 kg/m as calculated from the following:   Height as of this encounter: '5\' 2"'$  (1.575 m).   Weight as of this encounter: 77.3 kg.   DVT Prophylaxis - Aspirin Weight-bearing as tolerated.  Required additional night due to nausea. Expected discharge today if continuing to progress with physical therapy and symptoms managed. HEP upon discharge. Follow-up in clinic in 2 weeks.  Rainey Pines, PA-C Orthopedic Surgery [phone] 08/07/2021, 7:31 AM

## 2021-08-07 NOTE — Discharge Summary (Signed)
Physician Discharge Summary   Patient ID: Natasha [name] MRN: [id number] DOB/AGE: 1945/09/19 76 y.o.  Admit date: 08/05/2021 Discharge date: 08/07/2021  Primary Diagnosis: Osteoarthritis of the right hip  Admission Diagnoses:  Past Medical History:  Diagnosis Date   Arthritis    osteoarthritis L hip- job injury   Cataract    "being monitored"-bilateral   Chronic cystitis    no problems   DDD (degenerative disc disease), cervical    Diverticulosis    no problems   GERD (gastroesophageal reflux disease)    occasional - diet controlled - no meds   History of chicken pox    History of positive PPD    History of shingles    Hyperlipidemia    under control   Incontinence of urine    sometimes   Internal hemorrhoids    Lumbar spinal stenosis    Measles    Mumps    Neck pain    Palpitations    PONV (postoperative nausea and vomiting)    "hard time waking up with first colonoscopy-no problems since"   Rubella    Seasonal allergies    "year around"   SVD (spontaneous vaginal delivery)    x 2   Tinnitus    with daily aspirin intake   Discharge Diagnoses:   Principal Problem:   Primary osteoarthritis of right hip  Estimated body mass index is 31.17 kg/m as calculated from the following:   Height as of this encounter: '5\' 2"'$  (1.575 m).   Weight as of this encounter: 77.3 kg.  Procedure:  Procedure(s) (LRB): TOTAL HIP ARTHROPLASTY ANTERIOR APPROACH (Right)   Consults: None  HPI: Natasha [name] is a 76 y.o. female who has advanced end-stage arthritis of their right hip with progressively worsening pain and dysfunction.The patient has failed nonoperative management and presents for  total hip arthroplasty.   Laboratory Data: Admission on 08/05/2021, Discharged on 08/07/2021  Component Date Value Ref Range Status   WBC 08/06/2021 10.5  4.0 - 10.5 K/uL Final   RBC 08/06/2021 3.53 (L)  3.87 - 5.11 MIL/uL Final   Hemoglobin 08/06/2021 12.3  12.0 - 15.0 g/dL Final    HCT 08/06/2021 34.9 (L)  36.0 - 46.0 % Final   MCV 08/06/2021 98.9  80.0 - 100.0 fL Final   MCH 08/06/2021 34.8 (H)  26.0 - 34.0 pg Final   MCHC 08/06/2021 35.2  30.0 - 36.0 g/dL Final   RDW 08/06/2021 11.9  11.5 - 15.5 % Final   Platelets 08/06/2021 299  150 - 400 K/uL Final   nRBC 08/06/2021 0.0  0.0 - 0.2 % Final   Performed at [facility], [place] [address], [place], [place] [address]   Sodium 08/06/2021 135  135 - 145 mmol/L Final   Potassium 08/06/2021 3.7  3.5 - 5.1 mmol/L Final   Chloride 08/06/2021 103  98 - 111 mmol/L Final   CO2 08/06/2021 24  22 - 32 mmol/L Final   Glucose, Bld 08/06/2021 183 (H)  70 - 99 mg/dL Final   Glucose reference range applies only to samples taken after fasting for at least 8 hours.   BUN 08/06/2021 11  8 - 23 mg/dL Final   Creatinine, Ser 08/06/2021 0.71  0.44 - 1.00 mg/dL Final   Calcium 08/06/2021 8.0 (L)  8.9 - 10.3 mg/dL Final   GFR, Estimated 08/06/2021 >60  >60 mL/min Final   Comment: (NOTE) Calculated using the CKD-EPI Creatinine Equation (2021)    Anion gap  08/06/2021 8  5 - 15 Final   Performed at [facility], [place] [address], [place], [place] [address]   WBC 08/07/2021 13.0 (H)  4.0 - 10.5 K/uL Final   RBC 08/07/2021 3.54 (L)  3.87 - 5.11 MIL/uL Final   Hemoglobin 08/07/2021 12.3  12.0 - 15.0 g/dL Final   HCT 08/07/2021 34.9 (L)  36.0 - 46.0 % Final   MCV 08/07/2021 98.6  80.0 - 100.0 fL Final   MCH 08/07/2021 34.7 (H)  26.0 - 34.0 pg Final   MCHC 08/07/2021 35.2  30.0 - 36.0 g/dL Final   RDW 08/07/2021 12.0  11.5 - 15.5 % Final   Platelets 08/07/2021 282  150 - 400 K/uL Final   nRBC 08/07/2021 0.0  0.0 - 0.2 % Final   Performed at [facility], [place] [address], [place], [place] [address]  Hospital Outpatient Visit on 07/27/2021  Component Date Value Ref Range Status   MRSA, PCR 07/27/2021 NEGATIVE  NEGATIVE Final   Staphylococcus aureus 07/27/2021 NEGATIVE  NEGATIVE Final    Comment: (NOTE) The Xpert SA Assay (FDA approved for NASAL specimens in patients 58 years of age and older), is one component of a comprehensive surveillance program. It is not intended to diagnose infection nor to guide or monitor treatment. Performed at [facility], [place] [address], [place], [place] [address]    WBC 07/27/2021 6.6  4.0 - 10.5 K/uL Final   RBC 07/27/2021 4.09  3.87 - 5.11 MIL/uL Final   Hemoglobin 07/27/2021 14.2  12.0 - 15.0 g/dL Final   HCT 07/27/2021 40.5  36.0 - 46.0 % Final   MCV 07/27/2021 99.0  80.0 - 100.0 fL Final   MCH 07/27/2021 34.7 (H)  26.0 - 34.0 pg Final   MCHC 07/27/2021 35.1  30.0 - 36.0 g/dL Final   RDW 07/27/2021 11.8  11.5 - 15.5 % Final   Platelets 07/27/2021 329  150 - 400 K/uL Final   nRBC 07/27/2021 0.0  0.0 - 0.2 % Final   Performed at [facility], [place] [address] [place], [place] [address]   ABO/RH(D) 07/27/2021 B POS   Final   Antibody Screen 07/27/2021 NEG   Final   Sample Expiration 07/27/2021 08/08/2021,2359   Final   Extend sample reason 07/27/2021    Final                   Value:NO TRANSFUSIONS OR PREGNANCY IN THE PAST 3 MONTHS Performed at [facility] [address], [place], [place] [address]      X-Rays:DG Pelvis Portable  Result Date: 08/05/2021 CLINICAL DATA:  Status post right hip arthroplasty EXAM: PORTABLE PELVIS 1-2 VIEWS COMPARISON:  04/03/2014 FINDINGS: There is recent right hip arthroplasty. There are pockets of air in the soft tissues. There is previous arthroplasty and left hip. IMPRESSION: Recent right hip arthroplasty. Electronically Signed   By: Elmer Picker M.D.   On: 08/05/2021 14:28   DG HIP UNILAT WITH PELVIS 1V RIGHT  Result Date: 08/05/2021 CLINICAL DATA:  Right hip replacement. EXAM: DG HIP (WITH OR WITHOUT PELVIS) 1V RIGHT; DG C-ARM 1-60 MIN-NO REPORT Radiation exposure index: 2.0153 mGy. COMPARISON:  None Available. FINDINGS: Two  intraoperative fluoroscopic images were obtained of the right hip. The right femoral and acetabular components are well situated. IMPRESSION: Fluoroscopic guidance provided during right total hip arthroplasty. Electronically Signed   By: Marijo Conception M.D.   On: 08/05/2021 13:17  DG C-Arm 1-60 Min-No Report  Result Date: 08/05/2021 Fluoroscopy was utilized by the requesting physician.  No radiographic interpretation.    EKG: Orders placed or performed during the hospital encounter of 11/12/15   EKG 12-Lead   EKG 12-Lead   EKG 12-Lead   EKG 12-Lead   EKG 12-Lead   EKG 12-Lead   EKG     Hospital Course: Natasha [name] is a 76 y.o. who was admitted to [facility]. They were brought to the operating room on 08/05/2021 and underwent Procedure(s): [place].  Patient tolerated the procedure well and was later transferred to the recovery room and then to the orthopaedic floor for postoperative care. They were given PO and IV analgesics for pain control following their surgery. They were given 24 hours of postoperative antibiotics of  Anti-infectives (From admission, onward)    Start     Dose/Rate Route Frequency Ordered Stop   08/05/21 1800  ceFAZolin (ANCEF) IVPB 2g/100 mL premix        2 g 200 mL/hr over 30 Minutes Intravenous Every 6 hours 08/05/21 1543 08/06/21 1020   08/05/21 0945  ceFAZolin (ANCEF) IVPB 2g/100 mL premix        2 g 200 mL/hr over 30 Minutes Intravenous On call to O.R. 08/05/21 1025 08/05/21 1207     and started on DVT prophylaxis in the form of Aspirin.   PT and OT were ordered for total joint protocol. Discharge planning consulted to help with postop disposition and equipment needs. Patient had a fair night on the evening of surgery. They started to get up OOB with therapy on POD #0. Continued to work with therapy into POD #2. Patient was seen during rounds on day two and was ready to go home pending progress with physical  therapy as well as symptom management as patient experienced persistent nausea. Patient worked with therapy for 2 additional sessions and was meeting their goals. Dressing was changed and the incision was C/D/I.  They were discharged home later that day in stable condition.  Diet: Regular diet Activity: WBAT Follow-up: in 2 weeks Disposition: Home Discharged Condition: stable   Discharge Instructions     Call MD / Call 911   Complete by: As directed    If you experience chest pain or shortness of breath, CALL 911 and be transported to the hospital emergency room.  If you develope a fever above 101 F, pus (white drainage) or increased drainage or redness at the wound, or calf pain, call your surgeon's office.   Change dressing   Complete by: As directed    You have an adhesive waterproof bandage over the incision. Leave this in place until your first follow-up appointment. Once you remove this you will not need to place another bandage.   Constipation Prevention   Complete by: As directed    Drink plenty of fluids.  Prune juice may be helpful.  You may use a stool softener, such as Colace (over the counter) 100 mg twice a day.  Use MiraLax (over the counter) for constipation as needed.   Diet - low sodium heart healthy   Complete by: As directed    Do not sit on low chairs, stoools or toilet seats, as it may be difficult to get up from low surfaces   Complete by: As directed    Driving restrictions   Complete by: As directed    No driving for two weeks   Post-operative opioid taper  instructions:   Complete by: As directed    POST-OPERATIVE OPIOID TAPER INSTRUCTIONS: It is important to wean off of your opioid medication as soon as possible. If you do not need pain medication after your surgery it is ok to stop day one. Opioids include: Codeine, Hydrocodone(Norco, Vicodin), Oxycodone(Percocet, oxycontin) and hydromorphone amongst others.  Long term and even short term use of opiods can  cause: Increased pain response Dependence Constipation Depression Respiratory depression And more.  Withdrawal symptoms can include Flu like symptoms Nausea, vomiting And more Techniques to manage these symptoms Hydrate well Eat regular healthy meals Stay active Use relaxation techniques(deep breathing, meditating, yoga) Do Not substitute Alcohol to help with tapering If you have been on opioids for less than two weeks and do not have pain than it is ok to stop all together.  Plan to wean off of opioids This plan should start within one week post op of your joint replacement. Maintain the same interval or time between taking each dose and first decrease the dose.  Cut the total daily intake of opioids by one tablet each day Next start to increase the time between doses. The last dose that should be eliminated is the evening dose.      TED hose   Complete by: As directed    Use stockings (TED hose) for three weeks on both leg(s).  You may remove them at night for sleeping.   Weight bearing as tolerated   Complete by: As directed       Allergies as of 08/07/2021       Reactions   Wasp Venom Protein Swelling   Achromycin [tetracycline]    Disorientation    Bee Venom Swelling   Nsaids    Decreases kidney function         Medication List     STOP taking these medications    cyclobenzaprine 10 MG tablet Commonly known as: FLEXERIL       TAKE these medications    acetaminophen 650 MG CR tablet Commonly known as: TYLENOL Take 1,300 mg by mouth 2 (two) times daily.   Antioxidant Caps Take 1 capsule by mouth daily.   aspirin EC 325 MG tablet Take 1 tablet (325 mg total) by mouth 2 (two) times daily for 20 days. Then take one 81 mg aspirin once a day for three weeks. Then discontinue aspirin.   AZO BLADDER CONTROL/GO-LESS PO Take 1 tablet by mouth 2 (two) times daily.   B-12 5000 MCG Caps Take 5,000 mcg by mouth daily.   Beano Tabs Take 2 tablets by  mouth daily as needed (gas).   Biotin Plus Keratin 10000-100 MCG-MG Tabs Take 1 capsule by mouth daily.   Calcium 600+D3 Plus Minerals 600-800 MG-UNIT Tabs Take 1 tablet by mouth 2 (two) times daily.   cetirizine 10 MG tablet Commonly known as: ZYRTEC Take 10 mg by mouth daily.   chlorhexidine 0.12 % solution Commonly known as: PERIDEX Use as directed 15 mLs in the mouth or throat 2 (two) times daily as needed (infections).   CRANBERRY PO Take 1 capsule by mouth every evening.   diphenhydrAMINE 25 MG tablet Commonly known as: BENADRYL Take 50 mg by mouth daily as needed for allergies.   famotidine 20 MG tablet Commonly known as: PEPCID Take 20 mg by mouth daily as needed for heartburn or indigestion.   fluticasone 50 MCG/ACT nasal spray Commonly known as: FLONASE Place 2 sprays into both nostrils at bedtime as needed for allergies  or rhinitis.   ketotifen 0.025 % ophthalmic solution Commonly known as: ZADITOR Place 1 drop into both eyes 2 (two) times daily as needed (allergies).   LUBRICATING EYE DROPS OP Place 1 drop into both eyes as needed (dry eyes).   magnesium oxide 400 MG tablet Commonly known as: MAG-OX Take 400 mg by mouth every evening.   methocarbamol 500 MG tablet Commonly known as: ROBAXIN Take 1 tablet (500 mg total) by mouth every 6 (six) hours as needed for muscle spasms.   multivitamin capsule Take 1 capsule by mouth daily.   ondansetron 4 MG tablet Commonly known as: ZOFRAN Take 1 tablet (4 mg total) by mouth every 6 (six) hours as needed for nausea.   oxyCODONE 5 MG immediate release tablet Commonly known as: Oxy IR/ROXICODONE Take 1-2 tablets (5-10 mg total) by mouth every 6 (six) hours as needed for severe pain.   potassium gluconate 595 (99 K) MG Tabs tablet Take 595 mg by mouth daily.   pravastatin 40 MG tablet Commonly known as: PRAVACHOL Take 40 mg by mouth at bedtime.   pseudoephedrine 30 MG tablet Commonly known as:  SUDAFED Take 60 mg by mouth daily as needed for congestion.   pseudoephedrine 120 MG 12 hr tablet Commonly known as: SUDAFED Take 120 mg by mouth daily as needed for congestion.   sodium fluoride 1.1 % Gel dental gel Commonly known as: FLUORISHIELD Place 1 application onto teeth at bedtime.   traMADol 50 MG tablet Commonly known as: ULTRAM Take 1-2 tablets (50-100 mg total) by mouth every 6 (six) hours as needed for moderate pain. What changed:  when to take this reasons to take this   Vitamin D3 1.25 MG (50000 UT) Caps Take 5,000 Units by mouth daily.               Discharge Care Instructions  (From admission, onward)           Start     Ordered   08/06/21 0000  Weight bearing as tolerated        08/06/21 0751   08/06/21 0000  Change dressing       Comments: You have an adhesive waterproof bandage over the incision. Leave this in place until your first follow-up appointment. Once you remove this you will not need to place another bandage.   08/06/21 0751            Follow-up Information     Gaynelle Arabian, MD. Go on 08/18/2021.   Specialty: Orthopedic Surgery Why: You are scheduled for a follow up appointment on 08-18-21 on 1:30 pm. Contact information: [address] [place] [address] [phone]                 Signed: R. Jaynie Bream, PA-C Orthopedic Surgery 08/07/2021, 10:40 PM

## 2021-08-07 NOTE — Plan of Care (Signed)
Patient discharging home via private vehicle with husband. Ivan Anchors, RN 08/07/21 12:48 PM

## 2021-08-07 NOTE — Plan of Care (Signed)
Problem: Education: Goal: Knowledge of General Education information will improve Description: Including pain rating scale, medication(s)/side effects and non-pharmacologic comfort measures Outcome: Progressing   Problem: Clinical Measurements: Goal: Ability to maintain clinical measurements within normal limits will improve Outcome: Progressing   Problem: Pain Managment: Goal: General experience of comfort will improve Outcome: Progressing   Ivan Anchors, RN. 08/07/21 8:41 AM

## 2021-08-10 ENCOUNTER — Other Ambulatory Visit: Payer: Self-pay

## 2021-08-10 NOTE — Patient Outreach (Signed)
[facility]) Care Management  08/10/2021  Natasha [name] 07/21/1945 [id number]   Telephone call to patient for nurse call. Patient states she is doing ok considering she recently had surgery.  Patient is controlling pain with ultram.  Patient doing exercise given.  Patient reports some swelling to left. Discussed post -op swelling to be expected but is the swelling worsens to contact physician. She verbalized understanding. Patient mentions she does has case management with the orthopedic doctor.  Advised that this care manager will drop off at this time as she is an ortho bundle patient. She verbalized understanding.    Plan: RN CM will close case.    Jone Baseman, RN, MSN [facility] Care Management Care Management Coordinator Direct Line [phone] Toll Free: [phone]  Fax: [phone]

## 2021-08-18 ENCOUNTER — Encounter: Payer: Medicare HMO | Admitting: Neurology

## 2021-08-31 DIAGNOSIS — Z1231 Encounter for screening mammogram for malignant neoplasm of breast: Secondary | ICD-10-CM | POA: Diagnosis not present

## 2021-09-10 DIAGNOSIS — Z5189 Encounter for other specified aftercare: Secondary | ICD-10-CM | POA: Diagnosis not present

## 2021-09-16 ENCOUNTER — Other Ambulatory Visit (HOSPITAL_COMMUNITY): Payer: Self-pay

## 2021-09-16 DIAGNOSIS — M810 Age-related osteoporosis without current pathological fracture: Secondary | ICD-10-CM | POA: Diagnosis not present

## 2021-09-16 DIAGNOSIS — Z23 Encounter for immunization: Secondary | ICD-10-CM | POA: Diagnosis not present

## 2021-10-06 DIAGNOSIS — Z961 Presence of intraocular lens: Secondary | ICD-10-CM | POA: Diagnosis not present

## 2021-10-06 DIAGNOSIS — H16223 Keratoconjunctivitis sicca, not specified as Sjogren's, bilateral: Secondary | ICD-10-CM | POA: Diagnosis not present

## 2021-10-06 DIAGNOSIS — H26492 Other secondary cataract, left eye: Secondary | ICD-10-CM | POA: Diagnosis not present

## 2021-10-06 DIAGNOSIS — H18413 Arcus senilis, bilateral: Secondary | ICD-10-CM | POA: Diagnosis not present

## 2021-10-21 DIAGNOSIS — Z1283 Encounter for screening for malignant neoplasm of skin: Secondary | ICD-10-CM | POA: Diagnosis not present

## 2021-10-21 DIAGNOSIS — D225 Melanocytic nevi of trunk: Secondary | ICD-10-CM | POA: Diagnosis not present

## 2021-10-21 DIAGNOSIS — L820 Inflamed seborrheic keratosis: Secondary | ICD-10-CM | POA: Diagnosis not present

## 2021-10-22 ENCOUNTER — Other Ambulatory Visit (HOSPITAL_COMMUNITY): Payer: Medicare HMO

## 2021-10-23 DIAGNOSIS — Z0000 Encounter for general adult medical examination without abnormal findings: Secondary | ICD-10-CM | POA: Diagnosis not present

## 2021-10-23 DIAGNOSIS — Z111 Encounter for screening for respiratory tuberculosis: Secondary | ICD-10-CM | POA: Diagnosis not present

## 2021-11-05 DIAGNOSIS — R399 Unspecified symptoms and signs involving the genitourinary system: Secondary | ICD-10-CM | POA: Diagnosis not present

## 2021-11-05 DIAGNOSIS — H579 Unspecified disorder of eye and adnexa: Secondary | ICD-10-CM | POA: Diagnosis not present

## 2021-11-05 DIAGNOSIS — N3000 Acute cystitis without hematuria: Secondary | ICD-10-CM | POA: Diagnosis not present

## 2021-11-05 DIAGNOSIS — M810 Age-related osteoporosis without current pathological fracture: Secondary | ICD-10-CM | POA: Diagnosis not present

## 2021-12-14 DIAGNOSIS — R944 Abnormal results of kidney function studies: Secondary | ICD-10-CM | POA: Diagnosis not present

## 2021-12-16 DIAGNOSIS — H524 Presbyopia: Secondary | ICD-10-CM | POA: Diagnosis not present

## 2022-01-08 DIAGNOSIS — Z96642 Presence of left artificial hip joint: Secondary | ICD-10-CM | POA: Diagnosis not present

## 2022-01-08 DIAGNOSIS — M5136 Other intervertebral disc degeneration, lumbar region: Secondary | ICD-10-CM | POA: Diagnosis not present

## 2022-01-12 DIAGNOSIS — M25552 Pain in left hip: Secondary | ICD-10-CM | POA: Diagnosis not present

## 2022-01-19 DIAGNOSIS — M25552 Pain in left hip: Secondary | ICD-10-CM | POA: Diagnosis not present

## 2022-01-22 DIAGNOSIS — M25552 Pain in left hip: Secondary | ICD-10-CM | POA: Diagnosis not present

## 2022-01-25 DIAGNOSIS — H608X1 Other otitis externa, right ear: Secondary | ICD-10-CM | POA: Diagnosis not present

## 2022-01-25 DIAGNOSIS — E871 Hypo-osmolality and hyponatremia: Secondary | ICD-10-CM | POA: Diagnosis not present

## 2022-01-25 DIAGNOSIS — Z1331 Encounter for screening for depression: Secondary | ICD-10-CM | POA: Diagnosis not present

## 2022-01-25 DIAGNOSIS — J029 Acute pharyngitis, unspecified: Secondary | ICD-10-CM | POA: Diagnosis not present

## 2022-01-25 DIAGNOSIS — E7800 Pure hypercholesterolemia, unspecified: Secondary | ICD-10-CM | POA: Diagnosis not present

## 2022-01-25 DIAGNOSIS — Z0000 Encounter for general adult medical examination without abnormal findings: Secondary | ICD-10-CM | POA: Diagnosis not present

## 2022-01-25 DIAGNOSIS — R143 Flatulence: Secondary | ICD-10-CM | POA: Diagnosis not present

## 2022-01-25 DIAGNOSIS — M25512 Pain in left shoulder: Secondary | ICD-10-CM | POA: Diagnosis not present

## 2022-01-25 DIAGNOSIS — M810 Age-related osteoporosis without current pathological fracture: Secondary | ICD-10-CM | POA: Diagnosis not present

## 2022-01-26 DIAGNOSIS — M25552 Pain in left hip: Secondary | ICD-10-CM | POA: Diagnosis not present

## 2022-02-02 DIAGNOSIS — M25552 Pain in left hip: Secondary | ICD-10-CM | POA: Diagnosis not present

## 2022-02-05 DIAGNOSIS — M25552 Pain in left hip: Secondary | ICD-10-CM | POA: Diagnosis not present

## 2022-02-09 DIAGNOSIS — M25552 Pain in left hip: Secondary | ICD-10-CM | POA: Diagnosis not present

## 2022-02-17 DIAGNOSIS — M25552 Pain in left hip: Secondary | ICD-10-CM | POA: Diagnosis not present

## 2022-03-02 DIAGNOSIS — M25552 Pain in left hip: Secondary | ICD-10-CM | POA: Diagnosis not present

## 2022-03-12 DIAGNOSIS — J341 Cyst and mucocele of nose and nasal sinus: Secondary | ICD-10-CM | POA: Diagnosis not present

## 2022-03-12 DIAGNOSIS — K219 Gastro-esophageal reflux disease without esophagitis: Secondary | ICD-10-CM | POA: Diagnosis not present

## 2022-03-12 DIAGNOSIS — H04202 Unspecified epiphora, left lacrimal gland: Secondary | ICD-10-CM | POA: Diagnosis not present

## 2022-03-12 DIAGNOSIS — Z974 Presence of external hearing-aid: Secondary | ICD-10-CM | POA: Diagnosis not present

## 2022-03-12 DIAGNOSIS — H903 Sensorineural hearing loss, bilateral: Secondary | ICD-10-CM | POA: Diagnosis not present

## 2022-03-12 DIAGNOSIS — H6121 Impacted cerumen, right ear: Secondary | ICD-10-CM | POA: Diagnosis not present

## 2022-04-26 DIAGNOSIS — Z96641 Presence of right artificial hip joint: Secondary | ICD-10-CM | POA: Diagnosis not present

## 2022-04-28 DIAGNOSIS — M7611 Psoas tendinitis, right hip: Secondary | ICD-10-CM | POA: Diagnosis not present

## 2022-05-04 DIAGNOSIS — M7611 Psoas tendinitis, right hip: Secondary | ICD-10-CM | POA: Diagnosis not present

## 2022-05-07 DIAGNOSIS — K219 Gastro-esophageal reflux disease without esophagitis: Secondary | ICD-10-CM | POA: Diagnosis not present

## 2022-05-07 DIAGNOSIS — R030 Elevated blood-pressure reading, without diagnosis of hypertension: Secondary | ICD-10-CM | POA: Diagnosis not present

## 2022-05-07 DIAGNOSIS — S80861A Insect bite (nonvenomous), right lower leg, initial encounter: Secondary | ICD-10-CM | POA: Diagnosis not present

## 2022-05-07 DIAGNOSIS — W57XXXA Bitten or stung by nonvenomous insect and other nonvenomous arthropods, initial encounter: Secondary | ICD-10-CM | POA: Diagnosis not present

## 2022-05-07 DIAGNOSIS — M810 Age-related osteoporosis without current pathological fracture: Secondary | ICD-10-CM | POA: Diagnosis not present

## 2022-05-11 DIAGNOSIS — M7611 Psoas tendinitis, right hip: Secondary | ICD-10-CM | POA: Diagnosis not present

## 2022-05-18 DIAGNOSIS — M7611 Psoas tendinitis, right hip: Secondary | ICD-10-CM | POA: Diagnosis not present

## 2022-05-25 DIAGNOSIS — M7611 Psoas tendinitis, right hip: Secondary | ICD-10-CM | POA: Diagnosis not present

## 2022-06-16 ENCOUNTER — Ambulatory Visit
Admission: RE | Admit: 2022-06-16 | Discharge: 2022-06-16 | Disposition: A | Discharge status: Home / Self Care | Payer: Medicare HMO | Source: Ambulatory Visit | Attending: Urgent Care | Admitting: Urgent Care

## 2022-06-16 ENCOUNTER — Other Ambulatory Visit: Payer: Self-pay

## 2022-06-16 VITALS — BP 162/95 | HR 79 | Temp 98.2°F | Resp 18

## 2022-06-16 DIAGNOSIS — R8290 Unspecified abnormal findings in urine: Secondary | ICD-10-CM | POA: Insufficient documentation

## 2022-06-16 LAB — POCT URINALYSIS DIP (MANUAL ENTRY)
Bilirubin, UA: NEGATIVE
Blood, UA: NEGATIVE
Glucose, UA: NEGATIVE mg/dL
Ketones, POC UA: NEGATIVE mg/dL
Leukocytes, UA: NEGATIVE
Nitrite, UA: NEGATIVE
Protein Ur, POC: NEGATIVE mg/dL
Spec Grav, UA: 1.01 (ref 1.010–1.025)
Urobilinogen, UA: 0.2 E.U./dL
pH, UA: 7 (ref 5.0–8.0)

## 2022-06-16 NOTE — ED Provider Notes (Signed)
Natasha [name]    CSN: [id number] Arrival date & time: 06/16/22  1339      History   Chief Complaint Chief Complaint  Patient presents with   Urinary Frequency    Cloudy, foul-smelling urine x 4 weeks - Entered by patient    HPI Natasha [name] is a 77 y.o. female.    Urinary Frequency    Patient presents to urgent care with a complaint of cloudy urine with a foul odor for 4 weeks.  Patient states she has been drinking more water and taking cranberry pills to treat her symptoms.  She endorses lower abdominal pain.  Past Medical History:  Diagnosis Date   Arthritis    osteoarthritis L hip- job injury   Cataract    "being monitored"-bilateral   Chronic cystitis    no problems   DDD (degenerative disc disease), cervical    Diverticulosis    no problems   GERD (gastroesophageal reflux disease)    occasional - diet controlled - no meds   History of chicken pox    History of positive PPD    History of shingles    Hyperlipidemia    under control   Incontinence of urine    sometimes   Internal hemorrhoids    Lumbar spinal stenosis    Measles    Mumps    Neck pain    Palpitations    PONV (postoperative nausea and vomiting)    "hard time waking up with first colonoscopy-no problems since"   Rubella    Seasonal allergies    "year around"   SVD (spontaneous vaginal delivery)    x 2   Tinnitus    with daily aspirin intake    Patient Active Problem List   Diagnosis Date Noted   Primary osteoarthritis of right hip 08/05/2021   Age-related osteoporosis without current pathological fracture 11/18/2020   Anxiety 11/18/2020   Atrophic vaginitis 11/18/2020   Body mass index (BMI) 30.0-30.9, adult 11/18/2020   Cough 11/18/2020   Diverticular disease of colon 11/18/2020   Elevated blood-pressure reading without diagnosis of hypertension 11/18/2020   History of epistaxis 11/18/2020   Pain in left hip 11/18/2020   Osteoporosis 11/18/2020   Pure  hypercholesterolemia 11/18/2020   Raynaud disease 11/18/2020   Skin sensation disturbance 11/18/2020   Vertigo 11/18/2020   Bilateral hearing loss 06/21/2018   Pain of left hand 03/16/2018   Chronic dental pain 06/08/2016   Maxillary cyst 05/10/2016   Chronic rhinitis 11/17/2015   Epistaxis, recurrent 11/17/2015   Epiretinal membrane (ERM) of right eye 04/22/2015   Pseudophakia, right eye 04/22/2015   Nuclear sclerosis of left eye 06/27/2014   Retinal detachment of right eye with single break 06/27/2014   OA (osteoarthritis) of hip 04/03/2014    Past Surgical History:  Procedure Laterality Date   COLONOSCOPY     x 3   CYST EXCISION PERINEAL     removed as a child - general anesthesia   HYSTEROSCOPY WITH D & C N/A 04/03/2013   Procedure: DILATATION AND CURETTAGE /HYSTEROSCOPY;  Surgeon: Geryl Rankins, MD;  Location: [facility];  Service: Gynecology;  Laterality: N/A;   NASAL SINUS SURGERY  01/05/1983   ROTATOR CUFF REPAIR Right 04/04/2013   with bicep attachment   SHOULDER SURGERY Left 2019   TOTAL HIP ARTHROPLASTY Left 04/03/2014   Procedure: LEFT TOTAL HIP ARTHROPLASTY ANTERIOR APPROACH;  Surgeon: Ollen Gross, MD;  Location: [facility];  Service: Orthopedics;  Laterality: Left;   TOTAL  HIP ARTHROPLASTY Right 08/05/2021   Procedure: TOTAL HIP ARTHROPLASTY ANTERIOR APPROACH;  Surgeon: Ollen Gross, MD;  Location: [facility];  Service: Orthopedics;  Laterality: Right;    OB History   No obstetric history on file.      Home Medications    Prior to Admission medications   Medication Sig Start Date End Date Taking? Authorizing Provider  acetaminophen (TYLENOL) 650 MG CR tablet Take 1,300 mg by mouth 2 (two) times daily.    [provider]  Alpha-D-Galactosidase Charlyne Quale) TABS Take 2 tablets by mouth daily as needed (gas).    [provider]  Calcium Carbonate-Vit D-Min (CALCIUM 600+D3 PLUS MINERALS) 600-800 MG-UNIT TABS Take 1 tablet by mouth 2 (two) times daily.     [provider]  Carboxymethylcellul-Glycerin (LUBRICATING EYE DROPS OP) Place 1 drop into both eyes as needed (dry eyes).    [provider]  cetirizine (ZYRTEC) 10 MG tablet Take 10 mg by mouth daily.    [provider]  chlorhexidine (PERIDEX) 0.12 % solution Use as directed 15 mLs in the mouth or throat 2 (two) times daily as needed (infections).    [provider]  Cholecalciferol (VITAMIN D3) 1.25 MG (50000 UT) CAPS Take 5,000 Units by mouth daily.    [provider]  CRANBERRY PO Take 1 capsule by mouth every evening.    [provider]  Cyanocobalamin (B-12) 5000 MCG CAPS Take 5,000 mcg by mouth daily.    [provider]  diphenhydrAMINE (BENADRYL) 25 MG tablet Take 50 mg by mouth daily as needed for allergies. Patient not taking: Reported on 06/16/2022    [provider]  famotidine (PEPCID) 20 MG tablet Take 20 mg by mouth daily as needed for heartburn or indigestion.    [provider]  fluticasone (FLONASE) 50 MCG/ACT nasal spray Place 2 sprays into both nostrils at bedtime as needed for allergies or rhinitis.    [provider]  ketotifen (ZADITOR) 0.025 % ophthalmic solution Place 1 drop into both eyes 2 (two) times daily as needed (allergies).    [provider]  magnesium oxide (MAG-OX) 400 MG tablet Take 400 mg by mouth every evening.    [provider]  methocarbamol (ROBAXIN) 500 MG tablet Take 1 tablet (500 mg total) by mouth every 6 (six) hours as needed for muscle spasms. Patient not taking: Reported on 06/16/2022 08/07/21   Derenda Fennel, PA  Multiple Vitamin (MULTIVITAMIN) capsule Take 1 capsule by mouth daily.    [provider]  Multiple Vitamins-Minerals (ANTIOXIDANT) CAPS Take 1 capsule by mouth daily.    [provider]  ondansetron (ZOFRAN) 4 MG tablet Take 1 tablet (4 mg total) by mouth every 6 (six) hours as needed for nausea. Patient not  taking: Reported on 06/16/2022 08/07/21   Edmisten, Danford Bad L, PA  oxyCODONE (OXY IR/ROXICODONE) 5 MG immediate release tablet Take 1-2 tablets (5-10 mg total) by mouth every 6 (six) hours as needed for severe pain. Patient not taking: Reported on 06/16/2022 08/07/21   Arther Abbott L, PA  potassium gluconate 595 (99 K) MG TABS tablet Take 595 mg by mouth daily.    [provider]  pravastatin (PRAVACHOL) 40 MG tablet Take 40 mg by mouth at bedtime.    [provider]  pseudoephedrine (SUDAFED) 120 MG 12 hr tablet Take 120 mg by mouth daily as needed for congestion.    [provider]  pseudoephedrine (SUDAFED) 30 MG tablet Take 60 mg by  mouth daily as needed for congestion.    [provider]  Pumpkin Seed-Soy Germ (AZO BLADDER CONTROL/GO-LESS PO) Take 1 tablet by mouth 2 (two) times daily.    [provider]  sodium fluoride (FLUORISHIELD) 1.1 % GEL dental gel Place 1 application onto teeth at bedtime.    [provider]  Specialty Vitamins Products (BIOTIN PLUS KERATIN) 10000-100 MCG-MG TABS Take 1 capsule by mouth daily.    [provider]  traMADol (ULTRAM) 50 MG tablet Take 1-2 tablets (50-100 mg total) by mouth every 6 (six) hours as needed for moderate pain. Patient not taking: Reported on 06/16/2022 08/07/21   Derenda Fennel, PA    Family History Family History  Problem Relation Age of Onset   Dementia Mother     Social History Social History   Tobacco Use   Smoking status: Never   Smokeless tobacco: Never  Vaping Use   Vaping Use: Never used  Substance Use Topics   Alcohol use: No   Drug use: No     Allergies   Wasp venom protein, Achromycin [tetracycline], Bee venom, and Nsaids   Review of Systems Review of Systems  Genitourinary:  Positive for frequency.     Physical Exam Triage Vital Signs ED Triage Vitals  Enc Vitals Group     BP 06/16/22 1427 (!) 162/95     Pulse Rate 06/16/22 1427 79      Resp 06/16/22 1427 18     Temp 06/16/22 1427 98.2 F (36.8 C)     Temp Source 06/16/22 1427 Oral     SpO2 06/16/22 1427 95 %     Weight --      Height --      Head Circumference --      Peak Flow --      Pain Score 06/16/22 1404 1     Pain Loc --      Pain Edu? --      Excl. in GC? --    No data found.  Updated Vital Signs BP (!) 162/95 (BP Location: Left Arm)   Pulse 79   Temp 98.2 F (36.8 C) (Oral)   Resp 18   SpO2 95%   Visual Acuity Right Eye Distance:   Left Eye Distance:   Bilateral Distance:    Right Eye Near:   Left Eye Near:    Bilateral Near:     Physical Exam Vitals reviewed.  Constitutional:      Appearance: Normal appearance.  Skin:    General: Skin is warm and dry.  Neurological:     General: No focal deficit present.     Mental Status: She is alert and oriented to person, place, and time.  Psychiatric:        Mood and Affect: Mood normal.        Behavior: Behavior normal.      UC Treatments / Results  Labs (all labs ordered are listed, but only abnormal results are displayed) Labs Reviewed  URINE CULTURE  POCT URINALYSIS DIP (MANUAL ENTRY)    EKG   Radiology No results found.  Procedures Procedures (including critical care time)  Medications Ordered in UC Medications - No data to display  Initial Impression / Assessment and Plan / UC Course  I have reviewed the triage vital signs and the nursing notes.  Pertinent labs & imaging results that were available during my care of the patient were reviewed by me and considered in my medical decision making (  see chart for details).   [facility] is a 77 y.o. female presenting with foul smelling urine. Patient is afebrile without recent antipyretics, satting well on room air. Overall is well appearing though non-toxic, well hydrated, without respiratory distress.   Reviewed relevant chart history.   UA result is not suggestive of urinary tract infection.  Is WNL.  Will send for  confirmatory culture as patient states her symptoms are intermittent.  Will also attempt to facilitate the patient scheduling establish care visit with a new provider for follow-up.  Counseled patient on potential for adverse effects with medications prescribed/recommended today, ER and return-to-clinic precautions discussed, patient verbalized understanding and agreement with care plan.  Final Clinical Impressions(s) / UC Diagnoses   Final diagnoses:  Malodorous urine   Discharge Instructions   None    ED Prescriptions   None    PDMP not reviewed this encounter.   Charma Igo, [place] 06/16/22 1443

## 2022-06-16 NOTE — ED Notes (Addendum)
Attempted to get patient a pcp.  Patient recently moved from [place] to [place].  His provider retired, replacement, left the practice.  Patient strongly wanting a MD for PCP.  Options on website to make appts has few options.  Patient does have an appointment with old practice within a few weeks. Will return to the practice and see if they can coordinate next pcp. Patient is agreeable

## 2022-06-16 NOTE — ED Triage Notes (Signed)
Reports cloudy urine with odor for 4 weeks.  Patient has been drinking water and taking cranberry pills. Patient is starting to get lower abdominal pain.    Patient 's pcp has retired

## 2022-06-16 NOTE — Discharge Instructions (Signed)
A sample of your urine has been sent to lab to confirm that you have no infection.  If the test is positive, you will be contacted to arrange treatment.  Otherwise, please follow-up with your new primary care provider.

## 2022-06-18 ENCOUNTER — Telehealth (HOSPITAL_COMMUNITY): Payer: Self-pay | Admitting: Emergency Medicine

## 2022-06-18 LAB — URINE CULTURE: Culture: 50000 — AB

## 2022-06-18 MED ORDER — SULFAMETHOXAZOLE-TRIMETHOPRIM 800-160 MG PO TABS: 1.0000 | ORAL_TABLET | Freq: Two times a day (BID) | ORAL | 0 refills | Status: AC

## 2022-07-15 DIAGNOSIS — R0989 Other specified symptoms and signs involving the circulatory and respiratory systems: Secondary | ICD-10-CM | POA: Diagnosis not present

## 2022-07-15 DIAGNOSIS — E7800 Pure hypercholesterolemia, unspecified: Secondary | ICD-10-CM | POA: Diagnosis not present

## 2022-07-15 DIAGNOSIS — K5900 Constipation, unspecified: Secondary | ICD-10-CM | POA: Diagnosis not present

## 2022-07-15 DIAGNOSIS — R030 Elevated blood-pressure reading, without diagnosis of hypertension: Secondary | ICD-10-CM | POA: Diagnosis not present

## 2022-07-23 DIAGNOSIS — E7800 Pure hypercholesterolemia, unspecified: Secondary | ICD-10-CM | POA: Diagnosis not present

## 2022-07-23 LAB — LAB REPORT - SCANNED: EGFR: 66

## 2022-09-16 DIAGNOSIS — Z1231 Encounter for screening mammogram for malignant neoplasm of breast: Secondary | ICD-10-CM | POA: Diagnosis not present

## 2022-09-20 ENCOUNTER — Ambulatory Visit: Payer: Medicare HMO | Admitting: Cardiology

## 2022-09-27 ENCOUNTER — Ambulatory Visit: Payer: Medicare HMO | Admitting: Cardiology

## 2022-10-05 ENCOUNTER — Encounter: Payer: Self-pay | Admitting: Cardiology

## 2022-10-05 ENCOUNTER — Ambulatory Visit: Payer: Medicare HMO | Attending: Cardiology | Admitting: Cardiology

## 2022-10-05 VITALS — BP 140/88 | HR 86 | Ht 62.0 in | Wt 163.0 lb

## 2022-10-05 DIAGNOSIS — E782 Mixed hyperlipidemia: Secondary | ICD-10-CM | POA: Insufficient documentation

## 2022-10-05 DIAGNOSIS — R0609 Other forms of dyspnea: Secondary | ICD-10-CM | POA: Diagnosis not present

## 2022-10-05 DIAGNOSIS — I10 Essential (primary) hypertension: Secondary | ICD-10-CM

## 2022-10-05 NOTE — Progress Notes (Signed)
Cardiology Office Note:  .   Date:  10/05/2022  ID:  Natasha [name], DOB [birth year], MRN [id number] PCP: Elias Else, MD (Inactive)  [facility] Providers Cardiologist:  Truett Mainland, MD PCP: Carilyn Goodpasture, NP  History of Present Illness: .     77 year old Caucasian female with hypertension, hyperlipidemia  Patient was recently noted to have hypertension by PCP.  Patient's own accord of blood pressure readings from home range anywhere from 100/70 mmHg to 150/100 mmHg.  Patient tells me that she is also noticed SBP as high as 200 mmHg, and as low as 87 mmHg.  Delving further into her history, it appears that high blood pressure episodes typically occur after she has had food with high salt such as a should food.  Low blood pressure readings usually occur when she is standing up after dinner.  She believes that her sodium has been low and therefore she tries to add sodium at times.  She drinks no more than 1-2 quarts of water daily.  She denies any chest pain, but does report exertional dyspnea with more than usual physical activity.   Vitals:   10/05/22 1315  BP: (!) 140/88  Pulse: 86  SpO2: 97%     ROS:  Review of Systems  Cardiovascular:  Positive for dyspnea on exertion. Negative for chest pain, leg swelling, palpitations and syncope.     Studies Reviewed: Marland Kitchen    EKG 10/05/2022: Sinus rhythm Nonspecific T wave abnormality  07/23/2022: Glucose 101, BUN/Cr 15/0.9. EGFR 66. K 4.7 Hb 12.3 Chol 203, TG 139, HDL 64, LDL 115  02/2021: TSH 3.4 normal   Risk Assessment/Calculations:       Physical Exam:   Physical Exam Vitals and nursing note reviewed.  Constitutional:      General: She is not in acute distress. Neck:     Vascular: No JVD.  Cardiovascular:     Rate and Rhythm: Normal rate and regular rhythm.     Heart sounds: Normal heart sounds. No murmur heard. Pulmonary:     Effort: Pulmonary effort is normal.     Breath sounds: Normal breath  sounds. No wheezing or rales.  Musculoskeletal:     Right lower leg: No edema.     Left lower leg: Edema (Trace) present.      VISIT DIAGNOSES:   ICD-10-CM   1. Primary hypertension  I10 EKG 12-Lead    ECHOCARDIOGRAM COMPLETE    CT CARDIAC SCORING (SELF PAY ONLY)    2. Mixed hyperlipidemia  E78.2 ECHOCARDIOGRAM COMPLETE    CT CARDIAC SCORING (SELF PAY ONLY)    3. Exertional dyspnea  R06.09        ASSESSMENT AND PLAN: .     77 year old Caucasian female with hypertension, hyperlipidemia  Hypertension: Labile blood pressure.  Typically triggered due to high sodium intake. I have counseled the patient regarding low-sodium diet.  I also encouraged her to increase fluid intake and use compression stockings. She will continue to keep a log of blood pressure readings. If blood pressure is elevated in spite of above changes, she will then likely benefit from antihypertensive therapy. I would probably start her with losartan 25-50 mg daily to overcome possibility of any orthostatic hypotension.  Exertional dyspnea: Previous echocardiogram performed in 02/2021 not available for my review. Recommend repeat echocardiogram.  Mixed hyperlipidemia: She is currently on pravastatin with LDL of 115.  I will check calcium score scan for risk stratification to decide whether we should switch to high intensity  statin.   Further recommendations after above testing.  F/u in 3 months  Signed, Elder Negus, MD

## 2022-10-05 NOTE — Patient Instructions (Signed)
Medication Instructions:   *If you need a refill on your cardiac medications before your next appointment, please call your pharmacy*   Lab Work:  If you have labs (blood work) drawn today and your tests are completely normal, you will receive your results only by: MyChart Message (if you have MyChart) OR A paper copy in the mail If you have any lab test that is abnormal or we need to change your treatment, we will call you to review the results.   Testing/Procedures:  Your physician has requested that you have an echocardiogram. Echocardiography is a painless test that uses sound waves to create images of your heart. It provides your doctor with information about the size and shape of your heart and how well your heart's chambers and valves are working. This procedure takes approximately one hour. There are no restrictions for this procedure. Please do NOT wear cologne, perfume, aftershave, or lotions (deodorant is allowed). Please arrive 15 minutes prior to your appointment time.   Calcium Score     Follow-Up: At [facility], you and your health needs are our priority.  As part of our continuing mission to provide you with exceptional heart care, we have created designated Provider Care Teams.  These Care Teams include your primary Cardiologist (physician) and Advanced Practice Providers (APPs -  Physician Assistants and Nurse Practitioners) who all work together to provide you with the care you need, when you need it.  We recommend signing up for the patient portal called "MyChart".  Sign up information is provided on this After Visit Summary.  MyChart is used to connect with patients for Virtual Visits (Telemedicine).  Patients are able to view lab/test results, encounter notes, upcoming appointments, etc.  Non-urgent messages can be sent to your provider as well.   To learn more about what you can do with MyChart, go to [email or web address].    Your next appointment:    3 month(s)  DR Rosemary Holms    Other Instructions  Coronary Calcium Scan A coronary calcium scan is an imaging test used to look for deposits of plaque in the inner lining of the blood vessels of the heart (coronary arteries). Plaque is made up of calcium, protein, and fatty substances. These deposits of plaque can partly clog and narrow the coronary arteries without producing any symptoms or warning signs. This puts a person at risk for a heart attack. A coronary calcium scan is performed using a computed tomography (CT) scanner machine without using a dye (contrast). This test is recommended for people who are at moderate risk for heart disease. The test can find plaque deposits before symptoms develop. Tell a health care provider about: Any allergies you have. All medicines you are taking, including vitamins, herbs, eye drops, creams, and over-the-counter medicines. Any problems you or family members have had with anesthetic medicines. Any bleeding problems you have. Any surgeries you have had. Any medical conditions you have. Whether you are pregnant or may be pregnant. What are the risks? Generally, this is a safe procedure. However, problems may occur, including: Harm to a pregnant woman and her unborn baby. This test involves the use of radiation. Radiation exposure can be dangerous to a pregnant woman and her unborn baby. If you are pregnant or think you may be pregnant, you should not have this procedure done. A slight increase in the risk of cancer. This is because of the radiation involved in the test. The amount of radiation from one test is similar to  the amount of radiation you are naturally exposed to over one year. What happens before the procedure? Ask your health care provider for any specific instructions on how to prepare for this procedure. You may be asked to avoid products that contain caffeine, tobacco, or nicotine for 4 hours before the procedure. What happens during  the procedure?  You will undress and remove any jewelry from your neck or chest. You may need to remove hearing aides and dentures. Women may need to remove their bras. You will put on a hospital gown. Sticky electrodes will be placed on your chest. The electrodes will be connected to an electrocardiogram (ECG) machine to record a tracing of the electrical activity of your heart. You will lie down on your back on a curved bed that is attached to the CT scanner. You may be given medicine to slow down your heart rate so that clear pictures can be created. You will be moved into the CT scanner, and the CT scanner will take pictures of your heart. During this time, you will be asked to lie still and hold your breath for 10-20 seconds at a time while each picture of your heart is being taken. The procedure may vary among health care providers and hospitals. What can I expect after the procedure? You can return to your normal activities. It is up to you to get the results of your procedure. Ask your health care provider, or the department that is doing the procedure, when your results will be ready. Summary A coronary calcium scan is an imaging test used to look for deposits of plaque in the inner lining of the blood vessels of the heart. Plaque is made up of calcium, protein, and fatty substances. A coronary calcium scan is performed using a CT scanner machine without contrast. Generally, this is a safe procedure. Tell your health care provider if you are pregnant or may be pregnant. Ask your health care provider for any specific instructions on how to prepare for this procedure. You can return to your normal activities after the scan is done. This information is not intended to replace advice given to you by your health care provider. Make sure you discuss any questions you have with your health care provider. Document Revised: 11/30/2020 Document Reviewed: 11/30/2020 Elsevier Patient Education   2024 [employer].

## 2022-10-12 ENCOUNTER — Other Ambulatory Visit: Payer: Self-pay | Admitting: Cardiology

## 2022-10-12 DIAGNOSIS — R0609 Other forms of dyspnea: Secondary | ICD-10-CM

## 2022-10-12 DIAGNOSIS — E782 Mixed hyperlipidemia: Secondary | ICD-10-CM

## 2022-10-12 DIAGNOSIS — I10 Essential (primary) hypertension: Secondary | ICD-10-CM

## 2022-10-19 DIAGNOSIS — I272 Pulmonary hypertension, unspecified: Secondary | ICD-10-CM | POA: Diagnosis not present

## 2022-10-19 DIAGNOSIS — R399 Unspecified symptoms and signs involving the genitourinary system: Secondary | ICD-10-CM | POA: Diagnosis not present

## 2022-10-19 DIAGNOSIS — I10 Essential (primary) hypertension: Secondary | ICD-10-CM | POA: Diagnosis not present

## 2022-10-26 ENCOUNTER — Ambulatory Visit
Admission: RE | Admit: 2022-10-26 | Discharge: 2022-10-26 | Disposition: A | Discharge status: Home / Self Care | Payer: Medicare HMO | Source: Ambulatory Visit | Attending: Cardiology | Admitting: Cardiology

## 2022-10-26 ENCOUNTER — Ambulatory Visit: Payer: Medicare HMO | Attending: Cardiology

## 2022-10-26 DIAGNOSIS — R0609 Other forms of dyspnea: Secondary | ICD-10-CM

## 2022-10-26 DIAGNOSIS — E782 Mixed hyperlipidemia: Secondary | ICD-10-CM | POA: Insufficient documentation

## 2022-10-26 DIAGNOSIS — I10 Essential (primary) hypertension: Secondary | ICD-10-CM | POA: Insufficient documentation

## 2022-10-26 LAB — ECHOCARDIOGRAM COMPLETE
AR max vel: 2.96 cm2
AV Area VTI: 2.94 cm2
AV Area mean vel: 2.84 cm2
AV Mean grad: 3 mm[Hg]
AV Peak grad: 5.6 mm[Hg]
AV Vena cont: 0.4 cm
Ao pk vel: 1.18 m/s
Area-P 1/2: 4.31 cm2
Calc EF: 55.1 %
P 1/2 time: 804 ms
S' Lateral: 3.3 cm
Single Plane A2C EF: 55.7 %
Single Plane A4C EF: 53.7 %

## 2022-10-28 ENCOUNTER — Telehealth: Payer: Self-pay

## 2022-10-28 DIAGNOSIS — E782 Mixed hyperlipidemia: Secondary | ICD-10-CM

## 2022-10-28 MED ORDER — ROSUVASTATIN CALCIUM 20 MG PO TABS: 20.0000 mg | ORAL_TABLET | Freq: Every day | ORAL | 3 refills | Status: DC

## 2022-10-28 NOTE — Telephone Encounter (Signed)
Spoke with patient and she would like to know if you can increase her pravastatin instead of changing to rosuvastatin?

## 2022-10-28 NOTE — Telephone Encounter (Signed)
-----   Message from [facility] sent at 10/28/2022 12:02 PM EDT ----- There is minimal calcium in heart arteries.  The patient is agreeable, we could switch pravastatin 40 mg daily to rosuvastatin 20 mg daily for further LDL reduction.  If patient prefers to stay on pravastatin, that is okay with me.  Thanks MJP

## 2022-10-28 NOTE — Telephone Encounter (Signed)
Spoke with patient and she will start crestor 20 mg daily. [facility] to [facility] per patient request. Fasting lipid ordered. Patient is aware

## 2022-10-28 NOTE — Telephone Encounter (Signed)
I do not think increasing pravastatin will get [place] as much LDL reduction as starting Crestor..  I am okay with patient staying on pravastatin 40 mg daily until she finishes the bottle, and then consider switching to Crestor.  If we do change to Crestor at some point, I would recommend repeat lipid panel before next visit in January 2025.

## 2022-10-28 NOTE — Progress Notes (Signed)
There is minimal calcium in heart arteries.  The patient is agreeable, we could switch pravastatin 40 mg daily to rosuvastatin 20 mg daily for further LDL reduction.  If patient prefers to stay on pravastatin, that is okay with me.  Thanks MJP

## 2022-12-10 DIAGNOSIS — N3000 Acute cystitis without hematuria: Secondary | ICD-10-CM | POA: Diagnosis not present

## 2022-12-24 DIAGNOSIS — Z87440 Personal history of urinary (tract) infections: Secondary | ICD-10-CM | POA: Diagnosis not present

## 2022-12-24 DIAGNOSIS — R300 Dysuria: Secondary | ICD-10-CM | POA: Diagnosis not present

## 2022-12-27 ENCOUNTER — Other Ambulatory Visit: Payer: Self-pay | Admitting: Emergency Medicine

## 2022-12-27 ENCOUNTER — Telehealth: Payer: Self-pay | Admitting: Emergency Medicine

## 2022-12-27 DIAGNOSIS — E782 Mixed hyperlipidemia: Secondary | ICD-10-CM | POA: Diagnosis not present

## 2022-12-27 NOTE — Telephone Encounter (Signed)
STAT call received for pt at [facility] and pt needs order for labs released. Orders released and stayed on phone with pt until [facility] verified

## 2022-12-28 LAB — LIPID PANEL
Chol/HDL Ratio: 2.5 {ratio} (ref 0.0–4.4)
Cholesterol, Total: 194 mg/dL (ref 100–199)
HDL: 78 mg/dL (ref >39)
LDL Chol Calc (NIH): 97 mg/dL (ref 0–99)
Triglycerides: 106 mg/dL (ref 0–149)
VLDL Cholesterol Cal: 19 mg/dL (ref 5–40)

## 2022-12-28 NOTE — Progress Notes (Signed)
Coronary calcium score of 12.8. This was 32nd percentile for age and sex matched control.  She is presently on pravastatin LDL goal <100.  LDL is at goal.  Continue the same.  Follow-up with PCP.

## 2023-01-14 ENCOUNTER — Encounter: Payer: Self-pay | Admitting: Cardiology

## 2023-01-14 ENCOUNTER — Ambulatory Visit: Payer: Medicare HMO | Attending: Cardiology | Admitting: Cardiology

## 2023-01-14 VITALS — BP 160/90 | HR 71 | Resp 16 | Ht 62.0 in | Wt 165.0 lb

## 2023-01-14 DIAGNOSIS — E782 Mixed hyperlipidemia: Secondary | ICD-10-CM | POA: Diagnosis not present

## 2023-01-14 DIAGNOSIS — R030 Elevated blood-pressure reading, without diagnosis of hypertension: Secondary | ICD-10-CM

## 2023-01-14 MED ORDER — HYDRALAZINE HCL 25 MG PO TABS: 25.0000 mg | ORAL_TABLET | Freq: Three times a day (TID) | ORAL | 4 refills | Status: AC | PRN

## 2023-01-14 MED ORDER — PRAVASTATIN SODIUM 40 MG PO TABS: 80.0000 mg | ORAL_TABLET | Freq: Every evening | ORAL | 3 refills | Status: DC

## 2023-01-14 NOTE — Patient Instructions (Addendum)
 Medication Instructions:   STOP TAKING CRESTOR  NOW  START TAKING PRAVASTATIN  80 MG BY MOUTH EVERY EVENING   START TAKING HYDRALAZINE  25 MG BY MOUTH EVERY 8 HOURS AS NEEDED FOR SYSTOLIC BLOOD PRESSURE >150 ON 2 READINGS IN A DAY  *If you need a refill on your cardiac medications before your next appointment, please call your pharmacy*   Lab Work:  IN 6 MONTHS AT A [facility]--CHECK LIPIDS--PLEASE COME FASTING TO THAT LAB APPOINTMENT  If you have labs (blood work) drawn today and your tests are completely normal, you will receive your results only by: MyChart Message (if you have MyChart) OR A paper copy in the mail If you have any lab test that is abnormal or we need to change your treatment, we will call you to review the results.    Follow-Up: At [facility], you and your health needs are our priority.  As part of our continuing mission to provide you with exceptional heart care, we have created designated Provider Care Teams.  These Care Teams include your primary Cardiologist (physician) and Advanced Practice Providers (APPs -  Physician Assistants and Nurse Practitioners) who all work together to provide you with the care you need, when you need it.  We recommend signing up for the patient portal called MyChart.  Sign up information is provided on this After Visit Summary.  MyChart is used to connect with patients for Virtual Visits (Telemedicine).  Patients are able to view lab/test results, encounter notes, upcoming appointments, etc.  Non-urgent messages can be sent to your provider as well.   To learn more about what you can do with MyChart, go to [email or web address].    Your next appointment:   1 year(s)  Provider:   DR. PATWARDHAN

## 2023-01-14 NOTE — Progress Notes (Signed)
  Cardiology Office Note:  .   Date:  01/14/2023  ID:  Natasha [name], DOB Feb 03, 1945, MRN [id number] PCP: Gib Charleston, MD (Inactive)  [facility] Providers Cardiologist:  Newman Lawrence, MD PCP: Gib Charleston, MD (Inactive)  Chief Complaint  Patient presents with   Primary hypertension   Follow-up      History of Present Illness: .    Natasha [name] is a 78 y.o. female with whitecoat hypertension, hyperlipidemia   Patient is doing well, denies any chest pain shortness of breath symptoms.  She did not tolerate rosuvastatin  due to myalgias, including TMJ pain which she has never had.  She is still taking rosuvastatin , but wishes to discontinue.  Blood pressure is elevated today.  I reviewed her home blood pressure log.  Barring occasional SBP >150 mmHg, blood pressure is very well-controlled.  She has history of elevated blood pressure readings in doctor offices.    Vitals:   01/14/23 1106  BP: (!) 160/90  Pulse: 71  Resp: 16  SpO2: 97%     ROS:  Review of Systems  Cardiovascular:  Negative for chest pain, dyspnea on exertion, leg swelling, palpitations and syncope.  Musculoskeletal:  Positive for myalgias.     Studies Reviewed: SABRA         Independently interpreted 12/2022: Chol 194, TG 106, HDL 78, LDL 97   Physical Exam:   Physical Exam Vitals and nursing note reviewed.  Constitutional:      General: She is not in acute distress. Neck:     Vascular: No JVD.  Cardiovascular:     Rate and Rhythm: Normal rate and regular rhythm.     Heart sounds: Normal heart sounds. No murmur heard. Pulmonary:     Effort: Pulmonary effort is normal.     Breath sounds: Normal breath sounds. No wheezing or rales.  Musculoskeletal:     Right lower leg: No edema.     Left lower leg: No edema.      VISIT DIAGNOSES:   ICD-10-CM   1. Mixed hyperlipidemia  E78.2 Lipid Profile    Lipid Profile    2. White coat syndrome without diagnosis of hypertension   R03.0        ASSESSMENT AND PLAN: .    Natasha [name] is a 78 y.o. female with whitecoat hypertension, hyperlipidemia   White coat hypertension: Normal blood pressure readings at home, with elevated blood pressure readings routine doctor office visit. Prescribe hydralazine  25 mg for as needed use, should her SBP stay >150 mmHg on 2 separate checks at home.  Mixed hyperlipidemia: Reported myalgias with rosuvastatin .  Okay to revert back to pravastatin , at high dose of 80 mg daily. Repeat lipid panel in 6 months.      Meds ordered this encounter  Medications   pravastatin  (PRAVACHOL ) 40 MG tablet    Sig: Take 2 tablets (80 mg total) by mouth every evening.    Dispense:  60 tablet    Refill:  3    Fill later, for pt will use supply on hand first.   hydrALAZINE  (APRESOLINE ) 25 MG tablet    Sig: Take 1 tablet (25 mg total) by mouth every 8 (eight) hours as needed (for systolic BP>150 on 2 readings in a day.).    Dispense:  30 tablet    Refill:  4     F/u in 1 year  Signed, Newman JINNY Lawrence, MD

## 2023-01-31 DIAGNOSIS — I272 Pulmonary hypertension, unspecified: Secondary | ICD-10-CM | POA: Diagnosis not present

## 2023-01-31 DIAGNOSIS — Z0000 Encounter for general adult medical examination without abnormal findings: Secondary | ICD-10-CM | POA: Diagnosis not present

## 2023-01-31 DIAGNOSIS — Z23 Encounter for immunization: Secondary | ICD-10-CM | POA: Diagnosis not present

## 2023-01-31 DIAGNOSIS — E7800 Pure hypercholesterolemia, unspecified: Secondary | ICD-10-CM | POA: Diagnosis not present

## 2023-01-31 DIAGNOSIS — M810 Age-related osteoporosis without current pathological fracture: Secondary | ICD-10-CM | POA: Diagnosis not present

## 2023-02-10 DIAGNOSIS — D225 Melanocytic nevi of trunk: Secondary | ICD-10-CM | POA: Diagnosis not present

## 2023-02-10 DIAGNOSIS — D2262 Melanocytic nevi of left upper limb, including shoulder: Secondary | ICD-10-CM | POA: Diagnosis not present

## 2023-02-10 DIAGNOSIS — D2272 Melanocytic nevi of left lower limb, including hip: Secondary | ICD-10-CM | POA: Diagnosis not present

## 2023-02-10 DIAGNOSIS — D2261 Melanocytic nevi of right upper limb, including shoulder: Secondary | ICD-10-CM | POA: Diagnosis not present

## 2023-02-10 DIAGNOSIS — L538 Other specified erythematous conditions: Secondary | ICD-10-CM | POA: Diagnosis not present

## 2023-02-10 DIAGNOSIS — L820 Inflamed seborrheic keratosis: Secondary | ICD-10-CM | POA: Diagnosis not present

## 2023-02-10 DIAGNOSIS — L2989 Other pruritus: Secondary | ICD-10-CM | POA: Diagnosis not present

## 2023-02-10 DIAGNOSIS — L570 Actinic keratosis: Secondary | ICD-10-CM | POA: Diagnosis not present

## 2023-02-10 DIAGNOSIS — R208 Other disturbances of skin sensation: Secondary | ICD-10-CM | POA: Diagnosis not present

## 2023-03-03 DIAGNOSIS — M8588 Other specified disorders of bone density and structure, other site: Secondary | ICD-10-CM | POA: Diagnosis not present

## 2023-03-03 DIAGNOSIS — R29890 Loss of height: Secondary | ICD-10-CM | POA: Diagnosis not present

## 2023-03-21 ENCOUNTER — Encounter: Payer: Self-pay | Admitting: Cardiology

## 2023-03-21 MED ORDER — PRAVASTATIN SODIUM 40 MG PO TABS: 40.0000 mg | ORAL_TABLET | Freq: Every day | ORAL | 3 refills | Status: DC

## 2023-03-22 NOTE — Telephone Encounter (Signed)
 Noted. Agree.  Thanks MJP

## 2023-03-28 MED ORDER — PRAVASTATIN SODIUM 40 MG PO TABS: 40.0000 mg | ORAL_TABLET | Freq: Every day | ORAL | 3 refills | Status: DC

## 2023-03-28 NOTE — Telephone Encounter (Signed)
 Pt states a new prescription was not sent in to [facility]. Please Advise

## 2023-03-28 NOTE — Addendum Note (Signed)
 Addended by: Malena Peer D on: 03/28/2023 01:05 PM   Modules accepted: Orders

## 2023-03-29 DIAGNOSIS — L538 Other specified erythematous conditions: Secondary | ICD-10-CM | POA: Diagnosis not present

## 2023-03-29 DIAGNOSIS — L820 Inflamed seborrheic keratosis: Secondary | ICD-10-CM | POA: Diagnosis not present

## 2023-03-29 DIAGNOSIS — L821 Other seborrheic keratosis: Secondary | ICD-10-CM | POA: Diagnosis not present

## 2023-03-29 DIAGNOSIS — R208 Other disturbances of skin sensation: Secondary | ICD-10-CM | POA: Diagnosis not present

## 2023-03-29 DIAGNOSIS — L2989 Other pruritus: Secondary | ICD-10-CM | POA: Diagnosis not present

## 2023-05-09 DIAGNOSIS — H524 Presbyopia: Secondary | ICD-10-CM | POA: Diagnosis not present

## 2023-05-11 DIAGNOSIS — R208 Other disturbances of skin sensation: Secondary | ICD-10-CM | POA: Diagnosis not present

## 2023-05-11 DIAGNOSIS — L2989 Other pruritus: Secondary | ICD-10-CM | POA: Diagnosis not present

## 2023-05-11 DIAGNOSIS — L820 Inflamed seborrheic keratosis: Secondary | ICD-10-CM | POA: Diagnosis not present

## 2023-05-11 DIAGNOSIS — L538 Other specified erythematous conditions: Secondary | ICD-10-CM | POA: Diagnosis not present

## 2023-05-11 DIAGNOSIS — L821 Other seborrheic keratosis: Secondary | ICD-10-CM | POA: Diagnosis not present

## 2023-07-14 LAB — LAB REPORT - SCANNED
A1c: 5.5
Albumin, Urine POC: 8.2
Albumin/Creatinine Ratio, Urine, POC: 46
Creatinine, POC: 18 mg/dL

## 2023-07-18 ENCOUNTER — Encounter: Payer: Self-pay | Admitting: Cardiology

## 2023-07-30 ENCOUNTER — Ambulatory Visit: Payer: Self-pay | Admitting: Cardiology

## 2023-07-30 NOTE — Progress Notes (Signed)
 Slight increase in cholesterol since switching back to pravastatin . Would oyu be willing to add Zetia 10 mg daily?  Thanks MJP

## 2023-08-01 NOTE — Telephone Encounter (Signed)
 Patient is returning phone call.

## 2023-08-02 NOTE — Progress Notes (Signed)
 Yes. In fact, pravastatin  can be increased to 80 mg for optimal benefit. Okay to send refill.  Thanks MJP

## 2023-08-03 MED ORDER — PRAVASTATIN SODIUM 40 MG PO TABS: 60.0000 mg | ORAL_TABLET | Freq: Every day | ORAL | 3 refills | Status: DC

## 2023-08-03 NOTE — Addendum Note (Signed)
 Addended by: MANDA BOTTCHER B on: 08/03/2023 09:39 AM   Modules accepted: Orders

## 2023-11-29 ENCOUNTER — Encounter: Payer: Self-pay | Admitting: Cardiology

## 2023-12-06 ENCOUNTER — Encounter: Payer: Self-pay | Admitting: Cardiology

## 2023-12-06 DIAGNOSIS — E782 Mixed hyperlipidemia: Secondary | ICD-10-CM

## 2023-12-07 NOTE — Telephone Encounter (Signed)
 Slightly better than before.  She has been on pravastatin  and has wanted to continue pravastatin  in the past.  If she is open to switching to Crestor , recommend 10 mg daily.  Otherwise, continue pravastatin .  Thanks MJP

## 2023-12-07 NOTE — Telephone Encounter (Signed)
 Lipid Panel Reviewed date:11/09/2023 08:11:43 PM Interpretation: Performing Oja:Ojarnme Ochiltree, [address] [place], [place] [id number], Phone - [phone], Director - MDNagendra Notes/Report:  Cholesterol, Total 192 100-199 mg/dL    Triglycerides 96 9-850 mg/dL    HDL Cholesterol 70 >60 mg/dL    VLDL Cholesterol Cal 17 5-40 mg/dL    LDL Chol Calc (NIH) 105 0-99 mg/dL    Results from [facility]

## 2023-12-09 ENCOUNTER — Other Ambulatory Visit: Payer: Self-pay

## 2023-12-09 MED ORDER — ROSUVASTATIN CALCIUM 10 MG PO TABS: 10.0000 mg | ORAL_TABLET | Freq: Every day | ORAL | 3 refills | Status: AC

## 2023-12-09 NOTE — Telephone Encounter (Signed)
 Please repeat lipid panel in 3 months.  Thanks MJP

## 2024-01-12 ENCOUNTER — Other Ambulatory Visit: Payer: Self-pay | Admitting: Family Medicine

## 2024-01-12 DIAGNOSIS — R1031 Right lower quadrant pain: Secondary | ICD-10-CM

## 2024-01-20 ENCOUNTER — Ambulatory Visit
Admission: RE | Admit: 2024-01-20 | Discharge: 2024-01-20 | Disposition: A | Discharge status: Home / Self Care | Source: Ambulatory Visit | Attending: Family Medicine | Admitting: Family Medicine

## 2024-01-20 DIAGNOSIS — R1031 Right lower quadrant pain: Secondary | ICD-10-CM

## 2024-03-14 ENCOUNTER — Ambulatory Visit: Admitting: Cardiology
